# Patient Record
Sex: Male | Born: 1990 | Race: White | Hispanic: No | Marital: Married | Smoking: Never smoker
Health system: Southern US, Community
[De-identification: ages and names within clinical notes are randomized; demographics above are authoritative.]

## PROBLEM LIST (undated history)

## (undated) DIAGNOSIS — T7840XA Allergy, unspecified, initial encounter: Secondary | ICD-10-CM

## (undated) DIAGNOSIS — E785 Hyperlipidemia, unspecified: Secondary | ICD-10-CM

## (undated) DIAGNOSIS — F419 Anxiety disorder, unspecified: Secondary | ICD-10-CM

## (undated) HISTORY — DX: Hyperlipidemia, unspecified: E78.5

## (undated) HISTORY — DX: Allergy, unspecified, initial encounter: T78.40XA

## (undated) HISTORY — DX: Anxiety disorder, unspecified: F41.9

---
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
## 2010-11-12 LAB — URINALYSIS, ROUTINE W REFLEX MICROSCOPIC
Glucose, UA: NEGATIVE mg/dL
Hgb urine dipstick: NEGATIVE
Leukocytes, UA: NEGATIVE
Protein, ur: 30 mg/dL — AB
Specific Gravity, Urine: 1.025 (ref 1.005–1.030)
pH: 7 (ref 5.0–8.0)

## 2010-11-12 LAB — URINE MICROSCOPIC-ADD ON

## 2015-11-06 ENCOUNTER — Encounter: Payer: Self-pay | Admitting: Sports Medicine

## 2015-11-06 ENCOUNTER — Ambulatory Visit (INDEPENDENT_AMBULATORY_CARE_PROVIDER_SITE_OTHER): Payer: Commercial Managed Care - PPO | Admitting: Sports Medicine

## 2015-11-06 ENCOUNTER — Encounter (INDEPENDENT_AMBULATORY_CARE_PROVIDER_SITE_OTHER): Payer: Self-pay

## 2015-11-06 DIAGNOSIS — L03032 Cellulitis of left toe: Secondary | ICD-10-CM | POA: Diagnosis not present

## 2015-11-06 DIAGNOSIS — M79675 Pain in left toe(s): Secondary | ICD-10-CM | POA: Diagnosis not present

## 2015-11-06 DIAGNOSIS — L03012 Cellulitis of left finger: Secondary | ICD-10-CM

## 2015-11-06 MED ORDER — AMOXICILLIN-POT CLAVULANATE 875-125 MG PO TABS: 1.0000 | ORAL_TABLET | Freq: Two times a day (BID) | ORAL | 0 refills | Status: DC

## 2015-11-06 NOTE — Patient Instructions (Signed)

## 2015-11-06 NOTE — Progress Notes (Signed)
Subjective: Joshua Christian is a 25 y.o. male patient presents to office today complaining of a painful incurvated, red, hot, swollen lateral nail border of the first toe on the left foot. This has been present for weeks. Patient has treated this by trimming and soaking. Admits that several years ago had a nail procedure done, but appears that symptoms have recurred. Patient denies fever/chills/nausea/vomitting/any other related constitutional symptoms at this time.  There are no active problems to display for this patient.   No current outpatient prescriptions on file prior to visit.   No current facility-administered medications on file prior to visit.     No Known Allergies  Objective:  There were no vitals filed for this visit.  General: Well developed, nourished, in no acute distress, alert and oriented x3   Dermatology: Skin is warm, dry and supple bilateral. Left hallux nail appears to be severely incurvated with hyperkeratosis formation at the distal aspects of  the lateral nail border. (+) Erythema. (+) Edema. (-) serosanguous  drainage present. The remaining nails appear unremarkable at this time. There are no open sores, lesions or other signs of infection  present.  Vascular: Dorsalis Pedis artery and Posterior Tibial artery pedal pulses are 2/4 bilateral with immedate capillary fill time. Pedal hair growth present. No lower extremity edema.   Neruologic: Grossly intact via light touch bilateral.  Musculoskeletal: Tenderness to palpation of the left hallux lateral nail fold(s). Muscular strength within normal limits in all groups bilateral.   Assesement and Plan: Problem List Items Addressed This Visit    None    Visit Diagnoses    Paronychia, left    -  Primary   Relevant Medications   amoxicillin-clavulanate (AUGMENTIN) 875-125 MG tablet   Toe pain, left       Relevant Medications   amoxicillin-clavulanate (AUGMENTIN) 875-125 MG tablet      -Discussed  treatment alternatives and plan of care; Explained permanent/temporary nail avulsion and post procedure course to patient.Patient opt for permanent nail avulsion, left hallux lateral margin. - After a verbal consent, injected 3 ml of a 50:50 mixture of 2% plain  lidocaine and 0.5% plain marcaine in a normal hallux block fashion. Next, a  betadine prep was performed. Anesthesia was tested and found to be appropriate.  The offending left hallux lateral nail border was then incised from the hyponychium to the epinychium. The offending nail border was removed and cleared from the field. The area was curretted for any remaining nail or spicules. Phenol application performed and the area was then flushed with alcohol and dressed with antibiotic cream and a dry sterile dressing. -Patient was instructed to leave the dressing intact for today and begin soaking  in a weak solution of betadine and water tomorrow. Patient was instructed to  soak for 15 minutes each day and apply neosporin and a gauze or bandaid dressing each day. -Prescribed Augmentin -Patient was instructed to monitor the toe for signs of infection and return to office if toe becomes red, hot or swollen. -Advised ice, elevation, and tylenol or motrin if needed for pain.  -Patient is to return in 1-2 weeks for follow up care/nail check or sooner if problems arise.  Asencion Islamitorya Hilda Rynders, DPM

## 2015-11-20 ENCOUNTER — Ambulatory Visit (INDEPENDENT_AMBULATORY_CARE_PROVIDER_SITE_OTHER): Payer: Commercial Managed Care - PPO | Admitting: Sports Medicine

## 2015-11-20 ENCOUNTER — Encounter: Payer: Self-pay | Admitting: Sports Medicine

## 2015-11-20 DIAGNOSIS — M79675 Pain in left toe(s): Secondary | ICD-10-CM

## 2015-11-20 DIAGNOSIS — Z9889 Other specified postprocedural states: Secondary | ICD-10-CM

## 2015-11-20 NOTE — Progress Notes (Signed)
Subjective: Joshua Christian is a 25 y.o. male patient returns to office today for follow up evaluation after having Left Hallux lateral permanent nail avulsion performed on 11-06-15. Patient has been soaking using betadine and applying topical antibiotic covered with bandaid daily. Patient denies fever/chills/nausea/vomitting/any other related constitutional symptoms at this time.  There are no active problems to display for this patient.   Current Outpatient Prescriptions on File Prior to Visit  Medication Sig Dispense Refill  . amoxicillin-clavulanate (AUGMENTIN) 875-125 MG tablet Take 1 tablet by mouth 2 (two) times daily. 28 tablet 0   No current facility-administered medications on file prior to visit.     No Known Allergies  Objective:  General: Well developed, nourished, in no acute distress, alert and oriented x3   Dermatology: Skin is warm, dry and supple bilateral. Left hallux lateral nail bed appears to be clean, dry, with mild granular tissue and surrounding eschar/scab. (-) Erythema. (-) Edema. (-) serosanguous drainage present. The remaining nails appear unremarkable at this time. There are no other lesions or other signs of infection present.  Neurovascular status: Intact. No lower extremity swelling; No pain with calf compression bilateral.  Musculoskeletal: Decreased tenderness to palpation of the left hallux lateral nail fold. Muscular strength within normal limits bilateral.   Assesement and Plan: Problem List Items Addressed This Visit    None    Visit Diagnoses    S/P nail surgery    -  Primary   Toe pain, left          -Examined patient  -Cleansed left hallux lateral nail fold and gently scrubbed with peroxide and q-tip/curetted away eschar at site and applied antibiotic cream covered with bandaid.  -Discussed plan of care with patient. -Patient to now begin soaking in a weak solution of Epsom salt and warm water. Patient was instructed to soak for 15-20  minutes each day until the toe appears normal and there is no drainage, redness, tenderness, or swelling at the procedure site, and apply neosporin and a gauze or bandaid dressing each day as needed. May leave open to air at night. -Continue with augmentin until completed -Educated patient on long term care after nail surgery. -Patient was instructed to monitor the toe for reoccurrence and signs of infection; Patient advised to return to office or go to ER if toe becomes red, hot or swollen. -Patient is to return as needed or sooner if problems arise.  Joshua Christian, DPM

## 2015-11-20 NOTE — Patient Instructions (Signed)

## 2021-03-17 ENCOUNTER — Other Ambulatory Visit: Payer: Self-pay

## 2021-03-17 ENCOUNTER — Encounter (HOSPITAL_COMMUNITY): Payer: Self-pay | Admitting: *Deleted

## 2021-03-17 ENCOUNTER — Emergency Department (HOSPITAL_COMMUNITY)
Admission: EM | Admit: 2021-03-17 | Discharge: 2021-03-18 | Disposition: A | Discharge status: Home / Self Care | Payer: Managed Care, Other (non HMO) | Attending: Emergency Medicine | Admitting: Emergency Medicine

## 2021-03-17 DIAGNOSIS — R2681 Unsteadiness on feet: Secondary | ICD-10-CM | POA: Diagnosis not present

## 2021-03-17 DIAGNOSIS — R42 Dizziness and giddiness: Secondary | ICD-10-CM | POA: Diagnosis present

## 2021-03-17 DIAGNOSIS — H539 Unspecified visual disturbance: Secondary | ICD-10-CM

## 2021-03-17 DIAGNOSIS — R519 Headache, unspecified: Secondary | ICD-10-CM | POA: Diagnosis not present

## 2021-03-17 DIAGNOSIS — R413 Other amnesia: Secondary | ICD-10-CM

## 2021-03-17 DIAGNOSIS — R4189 Other symptoms and signs involving cognitive functions and awareness: Secondary | ICD-10-CM | POA: Diagnosis not present

## 2021-03-17 DIAGNOSIS — H538 Other visual disturbances: Secondary | ICD-10-CM | POA: Insufficient documentation

## 2021-03-17 DIAGNOSIS — R110 Nausea: Secondary | ICD-10-CM | POA: Insufficient documentation

## 2021-03-17 LAB — CBC WITH DIFFERENTIAL/PLATELET
Abs Immature Granulocytes: 0.03 10*3/uL (ref 0.00–0.07)
Basophils Absolute: 0.1 10*3/uL (ref 0.0–0.1)
Basophils Relative: 1 %
Eosinophils Absolute: 0.2 10*3/uL (ref 0.0–0.5)
Eosinophils Relative: 3 %
HCT: 50.7 % (ref 39.0–52.0)
Hemoglobin: 17.4 g/dL — ABNORMAL HIGH (ref 13.0–17.0)
Immature Granulocytes: 0 %
Lymphocytes Relative: 29 %
Lymphs Abs: 2.7 10*3/uL (ref 0.7–4.0)
MCH: 28.6 pg (ref 26.0–34.0)
MCHC: 34.3 g/dL (ref 30.0–36.0)
MCV: 83.3 fL (ref 80.0–100.0)
Monocytes Absolute: 0.7 10*3/uL (ref 0.1–1.0)
Monocytes Relative: 7 %
Neutro Abs: 5.6 10*3/uL (ref 1.7–7.7)
Neutrophils Relative %: 60 %
Platelets: 376 10*3/uL (ref 150–400)
RBC: 6.09 MIL/uL — ABNORMAL HIGH (ref 4.22–5.81)
RDW: 11.9 % (ref 11.5–15.5)
WBC: 9.3 10*3/uL (ref 4.0–10.5)
nRBC: 0 % (ref 0.0–0.2)

## 2021-03-17 LAB — BASIC METABOLIC PANEL
Anion gap: 10 (ref 5–15)
BUN: 12 mg/dL (ref 6–20)
CO2: 25 mmol/L (ref 22–32)
Calcium: 9.3 mg/dL (ref 8.9–10.3)
Chloride: 102 mmol/L (ref 98–111)
Creatinine, Ser: 0.81 mg/dL (ref 0.61–1.24)
GFR, Estimated: >60 mL/min (ref >60)
Glucose, Bld: 81 mg/dL (ref 70–99)
Potassium: 3.4 mmol/L — ABNORMAL LOW (ref 3.5–5.1)
Sodium: 137 mmol/L (ref 135–145)

## 2021-03-17 NOTE — ED Triage Notes (Signed)
Pt states he started Lexapro on Saturday. Pt says Sunday he felt nauseated. Monday, he was dizzy and couldn't remember how to tie his shoes. He has not had a dose since yesterday, but still feels dizzy. Headache on and off since the beginning of January since going to the [place]

## 2021-03-17 NOTE — ED Provider Triage Note (Signed)
Emergency Medicine Provider Triage Evaluation Note  Joshua Christian , a 31 y.o. male  was evaluated in triage.  Pt complains of dizziness and confusion since starting Lexapro.  Patient states he began Lexapro due to having anxiety, he was able to obtain the prescription medication through [email or web address]  Patient denies any preceding event, trauma.  Patient is alert and oriented x4.  Patient also states that since he returned from the [place] over Christmas he has had persistent headache.  Review of Systems  Positive: Confusion, weakness Negative: Nausea, vomiting, fevers  Physical Exam  BP (!) 161/90 (BP Location: Right Arm)    Pulse 84    Temp 98.3 F (36.8 C) (Oral)    Resp 16    Ht 6' (1.829 m)    Wt 88.5 kg    SpO2 98%    BMI 26.45 kg/m  Gen:   Awake, no distress   Resp:  Normal effort  MSK:   Moves extremities without difficulty  Other:  No neurodeficits on exam  Medical Decision Making  Medically screening exam initiated at 7:47 PM.  Appropriate orders placed.  Joshua Christian was informed that the remainder of the evaluation will be completed by another provider, this initial triage assessment does not replace that evaluation, and the importance of remaining in the ED until their evaluation is complete.     Al Decant, PA-C 03/17/21 1949

## 2021-03-18 ENCOUNTER — Emergency Department (HOSPITAL_COMMUNITY): Payer: Managed Care, Other (non HMO)

## 2021-03-18 LAB — URINALYSIS, ROUTINE W REFLEX MICROSCOPIC
Bilirubin Urine: NEGATIVE
Glucose, UA: NEGATIVE mg/dL
Hgb urine dipstick: NEGATIVE
Ketones, ur: NEGATIVE mg/dL
Leukocytes,Ua: NEGATIVE
Nitrite: NEGATIVE
Protein, ur: NEGATIVE mg/dL
Specific Gravity, Urine: 1.023 (ref 1.005–1.030)
pH: 8 (ref 5.0–8.0)

## 2021-03-18 LAB — HIV ANTIBODY (ROUTINE TESTING W REFLEX): HIV Screen 4th Generation wRfx: NONREACTIVE

## 2021-03-18 IMAGING — MR MR MRV HEAD WO/W CM
4 of 5 series · 19 of 48 positions shown · IV contrast (gadavist)
Comparison: MRI head of the same date

CLINICAL DATA: Dural venous sinus thrombosis suspected. Headache
and dizziness.

EXAM:
MR VENOGRAM HEAD WITHOUT AND WITH CONTRAST
TECHNIQUE: Angiographic images of the intracranial venous structures were
acquired using MRV technique without and with intravenous contrast.
CONTRAST:  9mL GADAVIST GADOBUTROL 1 MMOL/ML IV SOLN

[Series 4: sag inhance(25 venc) · sagittal · 1.6mm · 0.47mm/px · 9 of 397 slices shown]
[im 20/397]
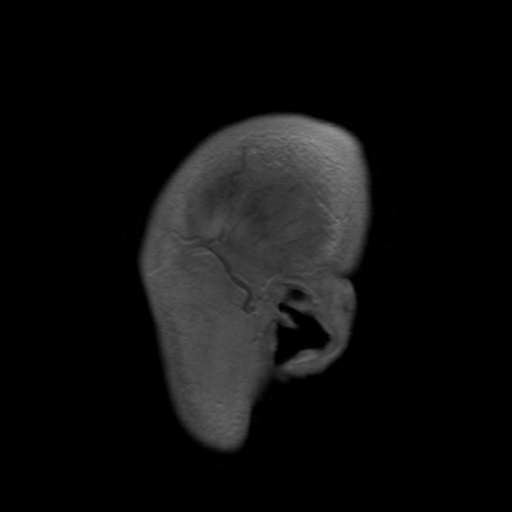
[im 60/397]
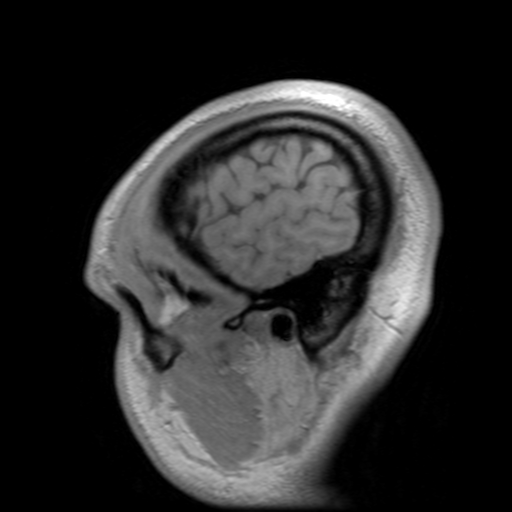
[im 119/397]
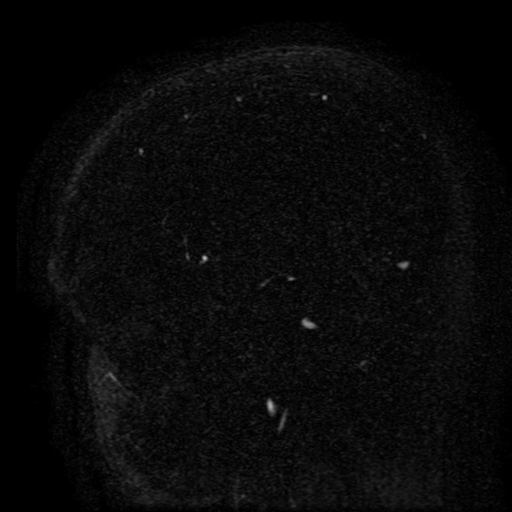
[im 179/397]
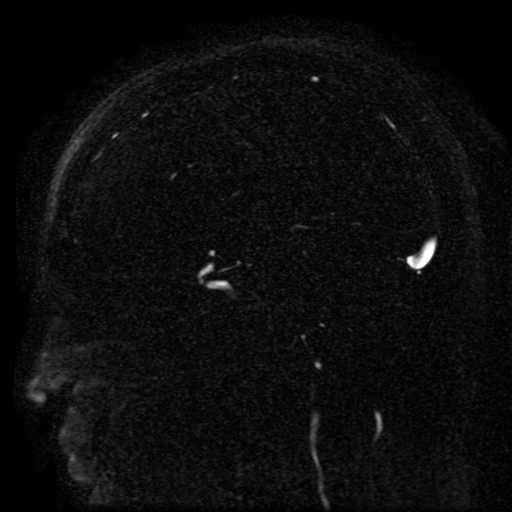
[im 199/397]
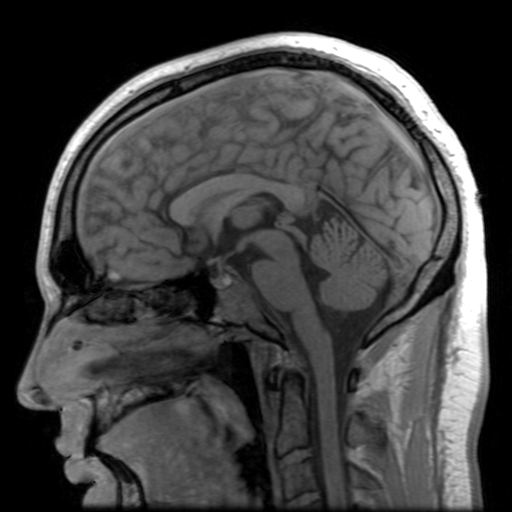
[im 218/397]
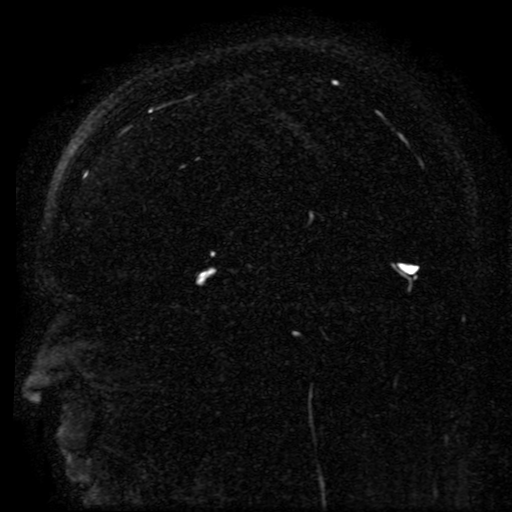
[im 278/397]
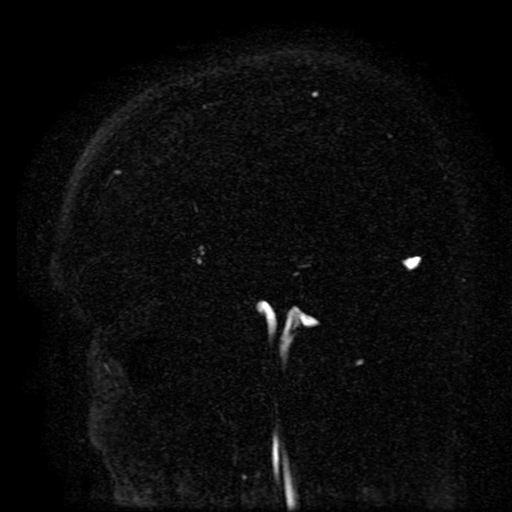
[im 337/397]
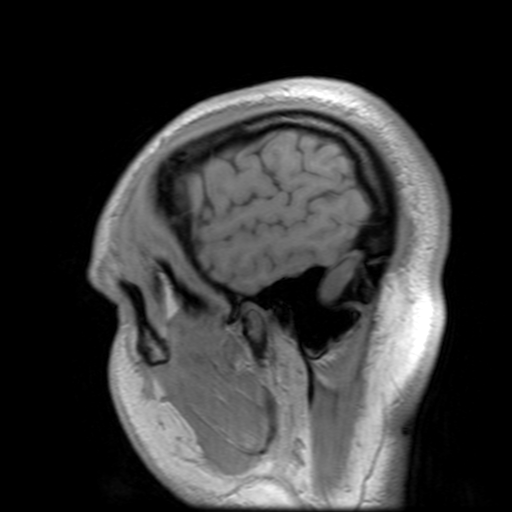
[im 377/397]
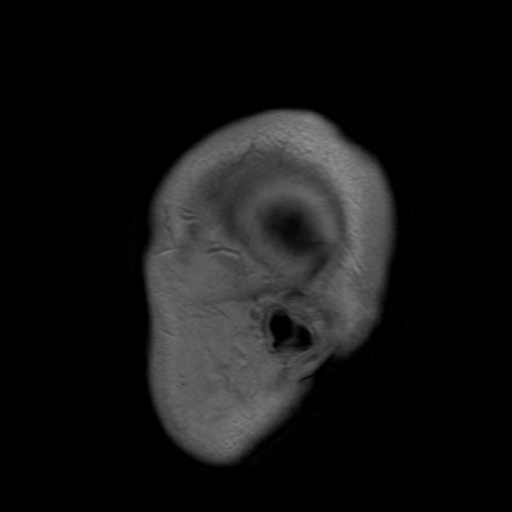

[Series 5: MRV · coronal · 1.5mm · 0.47mm/px · 4 of 130 slices shown]
[im 1/130]
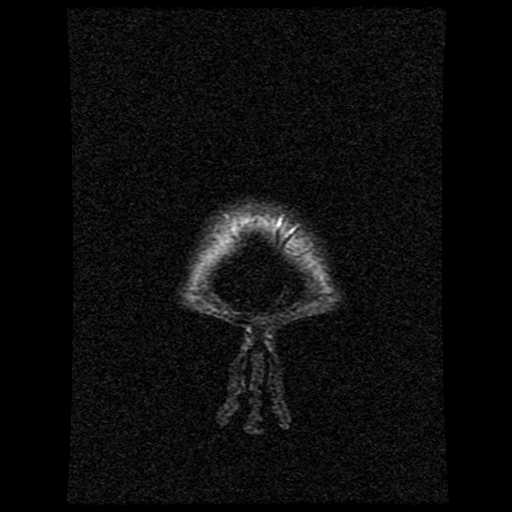
[im 22/130]
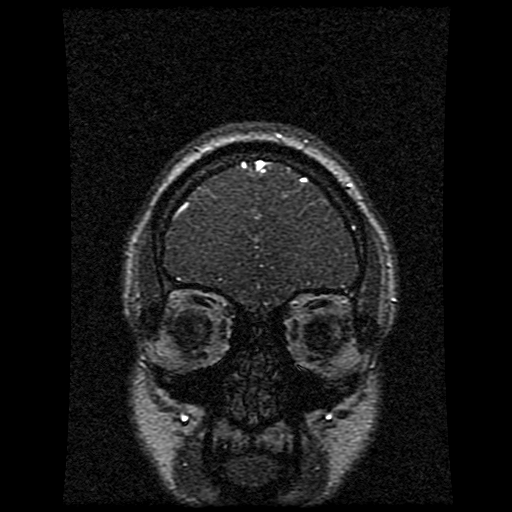
[im 65/130]
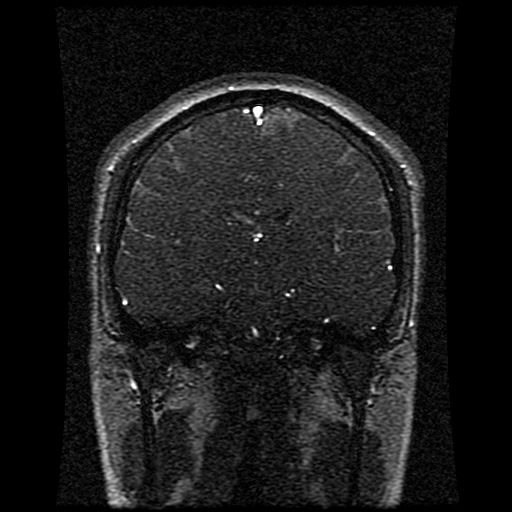
[im 108/130]
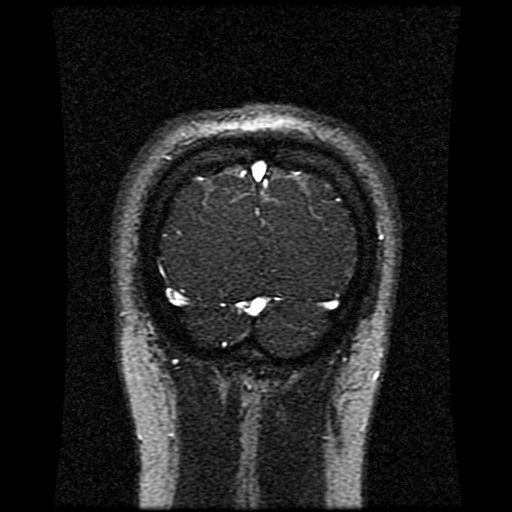

[Series 2200: processed images · axial · 1.0mm · 0.51mm/px · z∈[+26,+144]mm · 3 of 89 slices shown (1 of 2)]
[im 1/89]
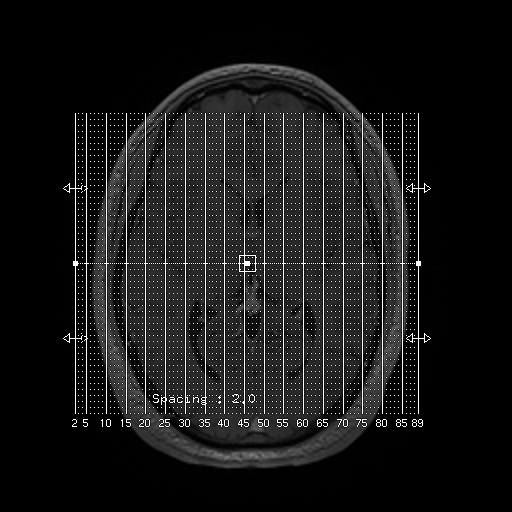
[im 45/89]
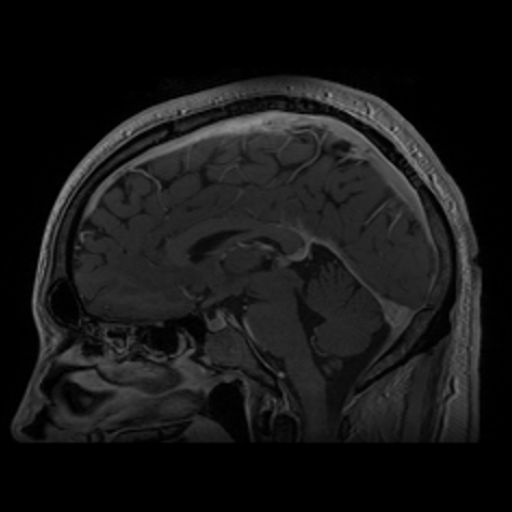
[im 89/89]
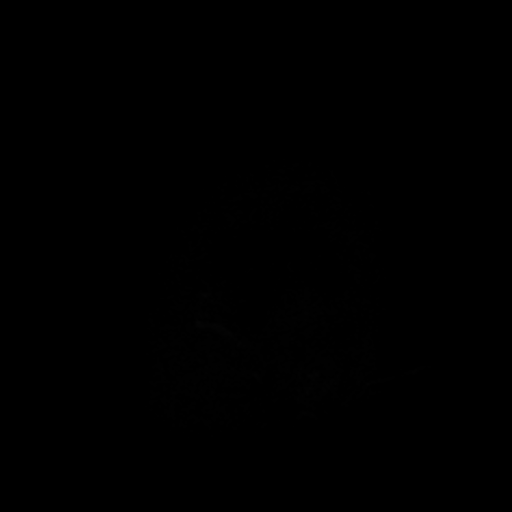

[Series 2201: processed images · coronal · 2.0mm · 0.47mm/px · 3 of 108 slices shown (2 of 2)]
[im 22/108]
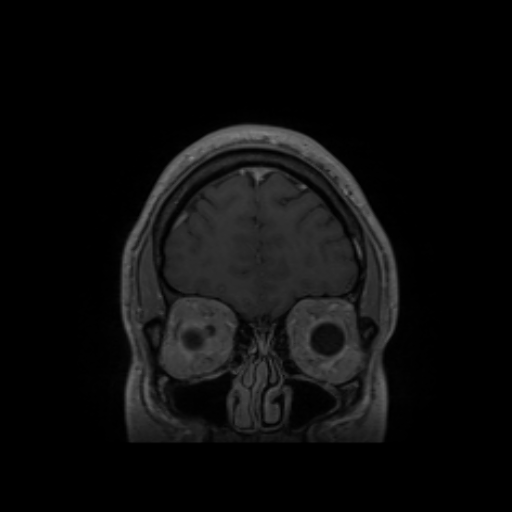
[im 65/108]
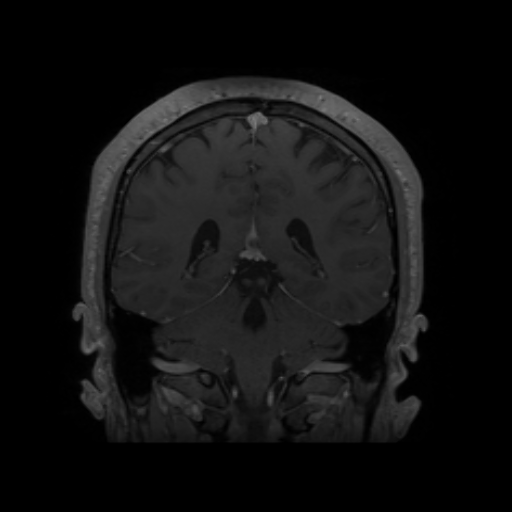
[im 108/108]
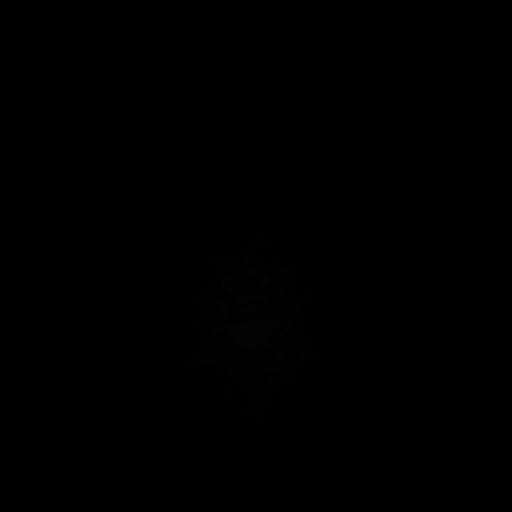

[19 of 48 positions shown; findings below may reference images not displayed]

FINDINGS: Normal flow related signal in the dural venous sinuses. Following
contrast infusion, there is normal enhancement of the dural venous
sinuses. No filling defect or obstruction.

Normal enhancement of the superior sagittal sinus. Normal
enhancement of the internal cerebral veins and straight sinus.
Transverse sinus, sigmoid sinus, and proximal jugular vein show
normal enhancement.
IMPRESSION: Normal MRV head.

## 2021-03-18 IMAGING — MR MR HEAD WO/W CM
9 of 12 series · 34 of 48 positions shown · IV contrast (gadavist)
Comparison: None.

CLINICAL DATA: Acute neuro deficit. Rule out stroke. Severe
headache. Intermittent vision changes. Unsteady gait.

EXAM:
MRI HEAD WITHOUT AND WITH CONTRAST
MRA HEAD WITHOUT CONTRAST
MRA NECK WITHOUT AND WITH CONTRAST
TECHNIQUE: Multiplanar, multiecho pulse sequences of the brain and surrounding
structures were obtained without and with intravenous contrast.
Angiographic images of the Circle of Willis were obtained using MRA
technique without intravenous contrast. Angiographic images of the
neck were obtained using MRA technique without and with intravenous
contrast. Carotid stenosis measurements (when applicable) are
obtained utilizing NASCET criteria, using the distal internal
carotid diameter as the denominator.
CONTRAST:  9mL GADAVIST GADOBUTROL 1 MMOL/ML IV SOLN

[Series 6: DWI · axial · 3.0mm · 1.09mm/px · z∈[-56,+97]mm · 8 of 104 slices shown (1 of 4)]
[im 1/104]
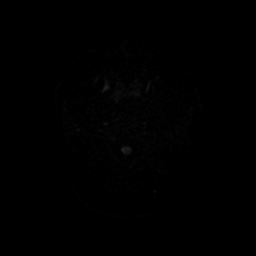
[im 12/104]
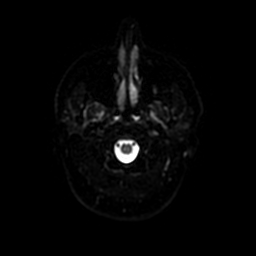
[im 35/104]
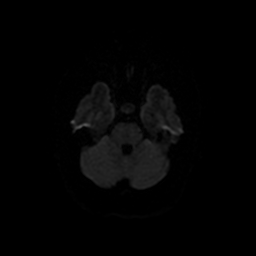
[im 46/104]
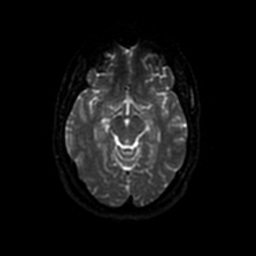
[im 58/104]
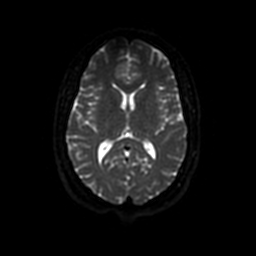
[im 69/104]
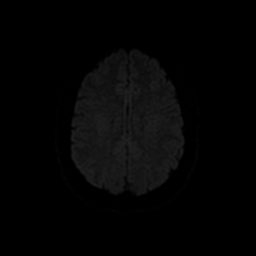
[im 92/104]
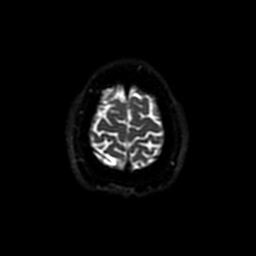
[im 104/104]
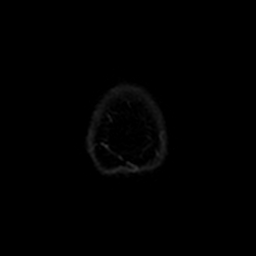

[Series 7: DWI · coronal · 5.0mm · 1.09mm/px · 7 of 80 slices shown (2 of 4)]
[im 1/80]
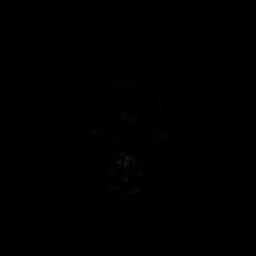
[im 14/80]
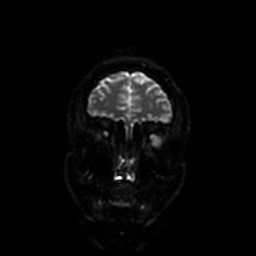
[im 27/80]
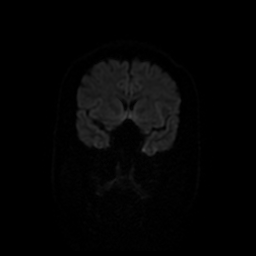
[im 40/80]
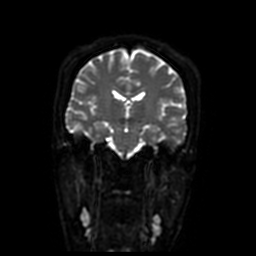
[im 53/80]
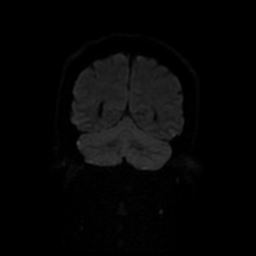
[im 66/80]
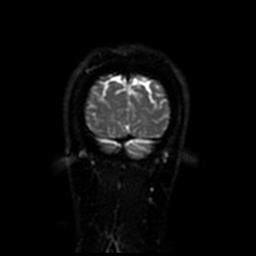
[im 80/80]
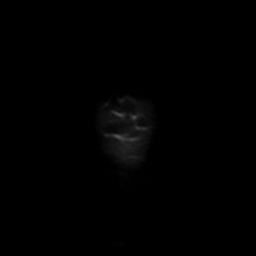

[Series 9: T2 · axial · 5.0mm · 0.45mm/px · z∈[-53,+91]mm · 2 of 25 slices shown]
[im 1/25]
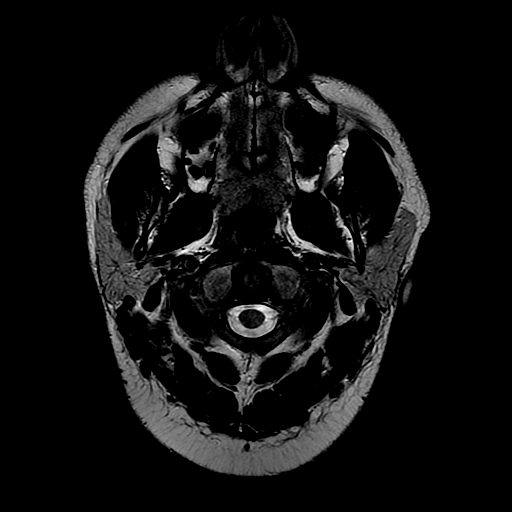
[im 25/25]
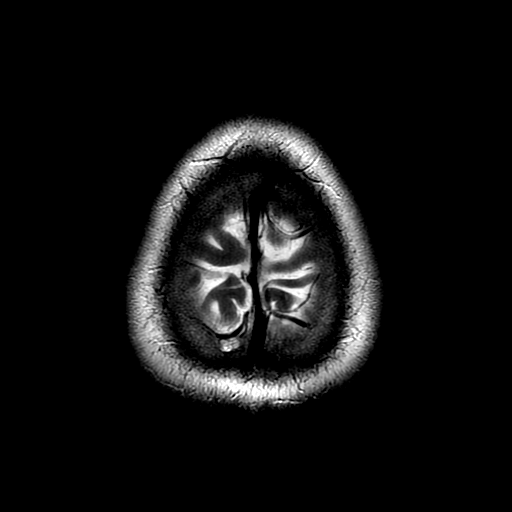

[Series 10: FLAIR · axial · 3.0mm · 0.45mm/px · z∈[-53,+91]mm · 2 of 25 slices shown]
[im 1/25]
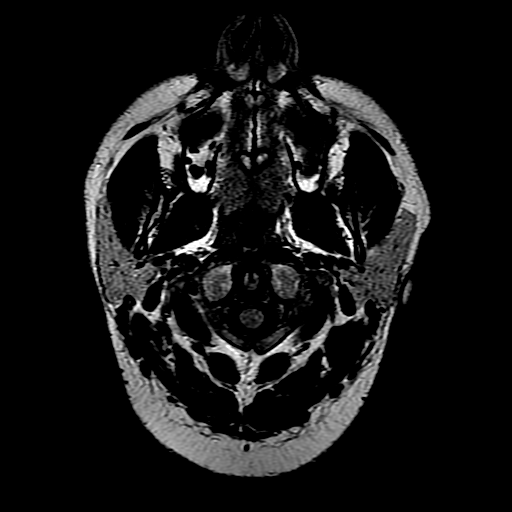
[im 25/25]
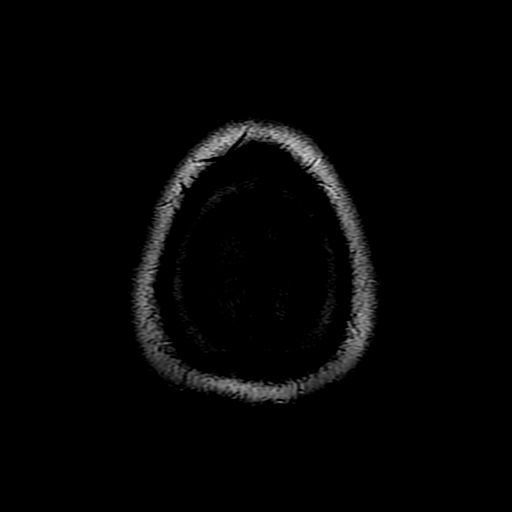

[Series 18: T2 post-contrast · coronal · 5.0mm · 0.39mm/px · 2 of 24 slices shown]
[im 1/24]
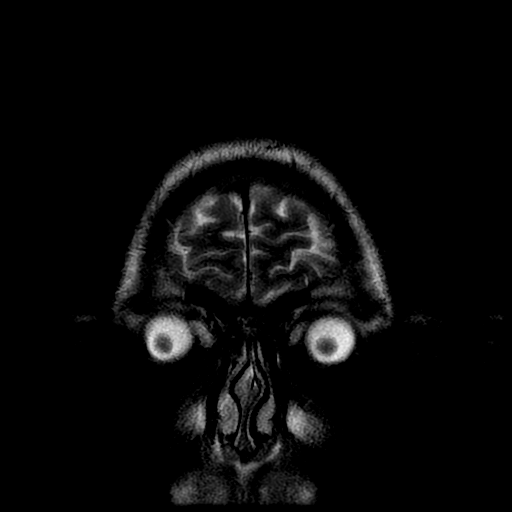
[im 24/24]
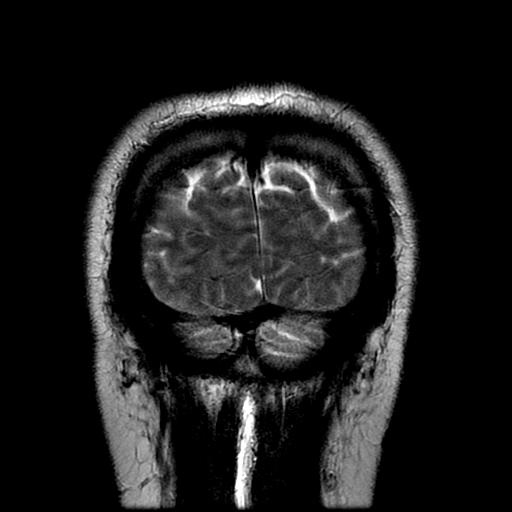

[Series 19: T1 post-contrast · axial · 3.0mm · 0.45mm/px · z∈[-54,+92]mm · 4 of 50 slices shown (1 of 2)]
[im 1/50]
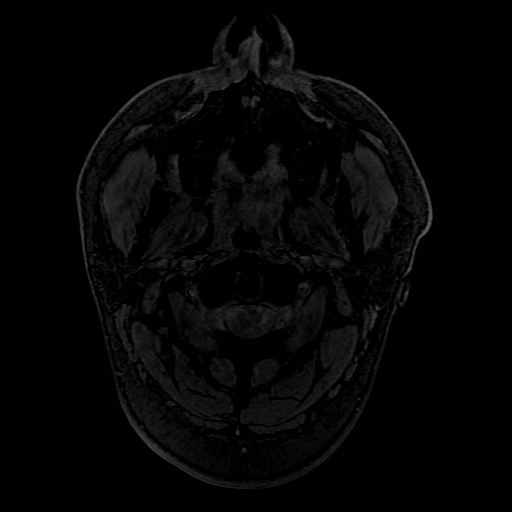
[im 17/50]
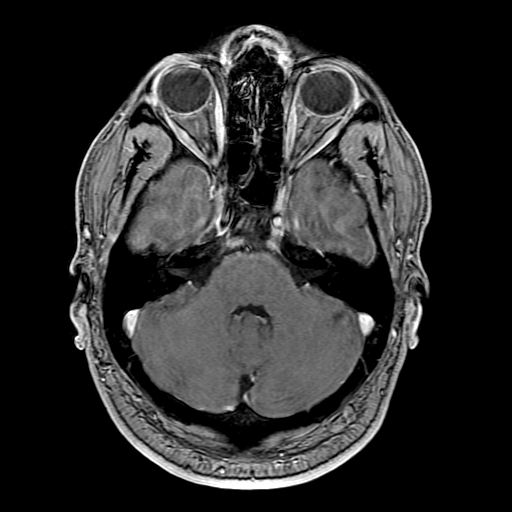
[im 33/50]
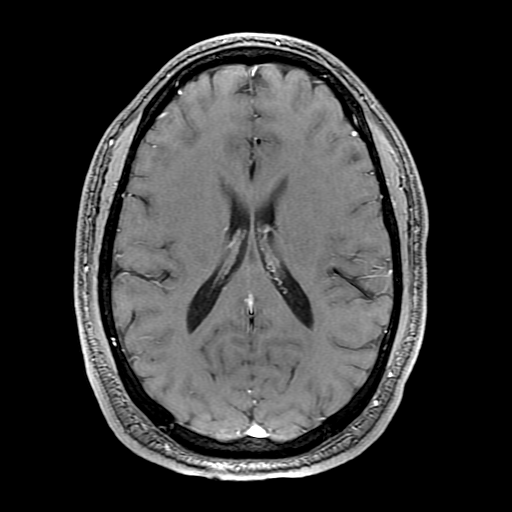
[im 50/50]
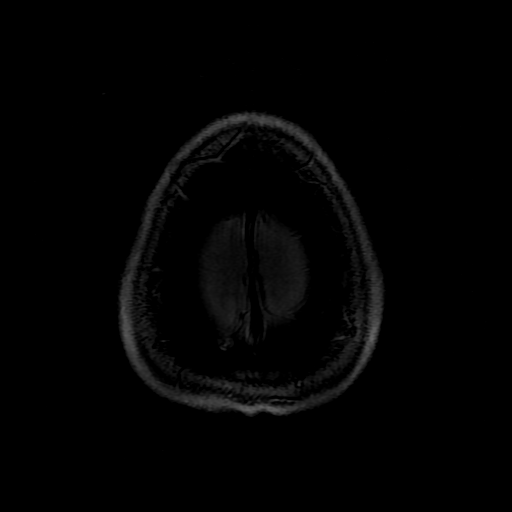

[Series 20: T1 post-contrast · coronal · 5.0mm · 0.39mm/px · 2 of 24 slices shown (2 of 2)]
[im 1/24]
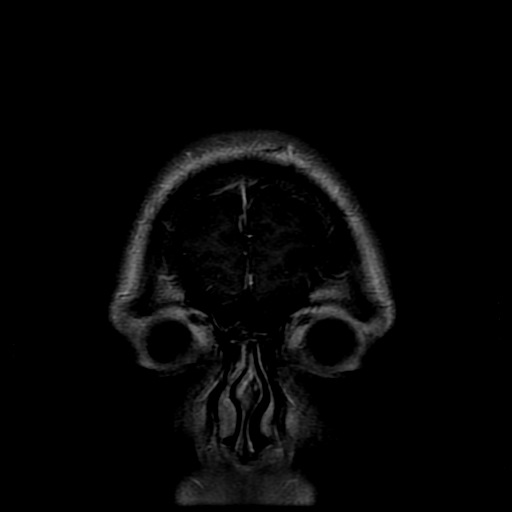
[im 24/24]
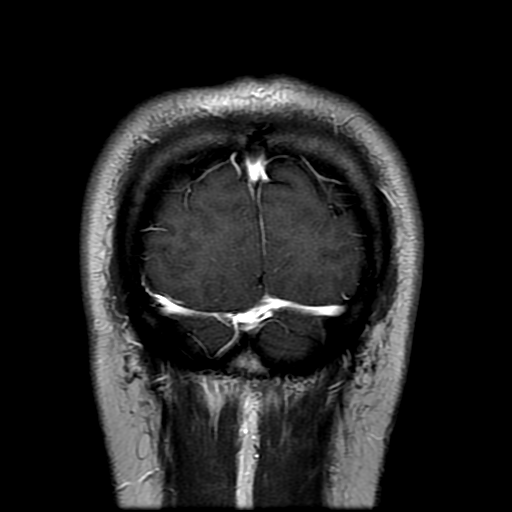

[Series 600: DWI · axial · 3.0mm · 1.09mm/px · z∈[-56,+97]mm · 4 of 52 slices shown (3 of 4)]
[im 1/52]
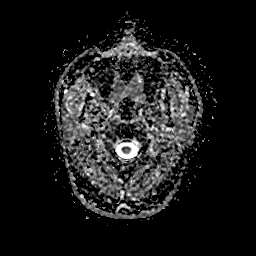
[im 18/52]
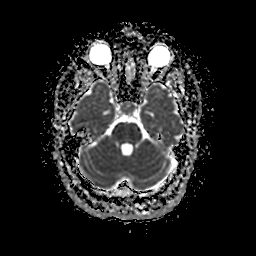
[im 35/52]
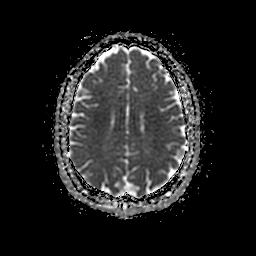
[im 52/52]
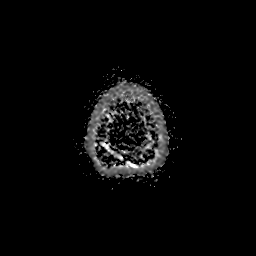

[Series 700: DWI · coronal · 5.0mm · 1.09mm/px · 3 of 40 slices shown (4 of 4)]
[im 1/40]
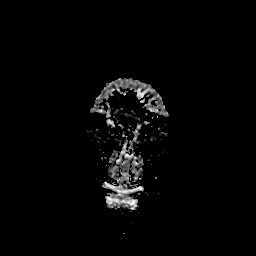
[im 20/40]
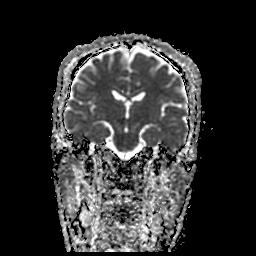
[im 40/40]
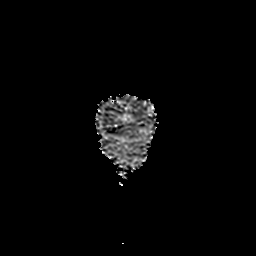

[34 of 48 positions shown; findings below may reference images not displayed]

FINDINGS: MRI HEAD FINDINGS

Brain: No acute infarction, hemorrhage, hydrocephalus, extra-axial
collection or mass lesion. Normal white matter. Negative for
demyelinating disease. Normal enhancement of the brain.

Vascular: Normal arterial flow voids.  Normal venous enhancement.

Skull and upper cervical spine: No focal skeletal lesion.

Sinuses/Orbits: Mild mucosal edema paranasal sinuses. Negative orbit

Other: None

MRA HEAD FINDINGS

Internal carotid artery widely patent without stenosis or aneurysm.
Anterior and middle cerebral arteries widely patent bilaterally.

Both vertebral arteries widely patent. PICA patent bilaterally.
Basilar widely patent. Superior cerebellar and posterior cerebral
arteries patent bilaterally. Fetal origin right posterior cerebral
artery. Left posterior communicating artery patent.

Negative for cerebral aneurysm.

MRA NECK FINDINGS

Normal aortic arch and proximal great vessels. Left vertebral artery
origin from the arch, a normal variant.

Antegrade flow in the carotid and vertebral artery bilaterally

Internal carotid artery widely patent bilaterally. Carotid
bifurcation normal. No stenosis or irregularity.

Both vertebral arteries patent to the basilar without stenosis.
IMPRESSION: 1. Normal MRI head without with contrast
2. Normal MRA head
3. Normal MRA neck

## 2021-03-18 IMAGING — MR MR MRA HEAD W/O CM
2 series · 19 of 48 positions shown · IV contrast (gadavist)
Comparison: None.

CLINICAL DATA: Acute neuro deficit. Rule out stroke. Severe
headache. Intermittent vision changes. Unsteady gait.

EXAM:
MRI HEAD WITHOUT AND WITH CONTRAST
MRA HEAD WITHOUT CONTRAST
MRA NECK WITHOUT AND WITH CONTRAST
TECHNIQUE: Multiplanar, multiecho pulse sequences of the brain and surrounding
structures were obtained without and with intravenous contrast.
Angiographic images of the Circle of Willis were obtained using MRA
technique without intravenous contrast. Angiographic images of the
neck were obtained using MRA technique without and with intravenous
contrast. Carotid stenosis measurements (when applicable) are
obtained utilizing NASCET criteria, using the distal internal
carotid diameter as the denominator.
CONTRAST:  9mL GADAVIST GADOBUTROL 1 MMOL/ML IV SOLN

[Series 3: (id) mt fs · axial · 1.4mm · 0.43mm/px · z∈[-63,+27]mm · 18 of 136 slices shown]
[im 1/136]
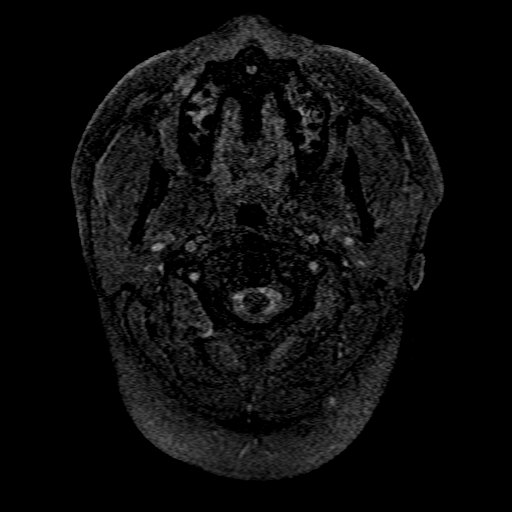
[im 3/136]
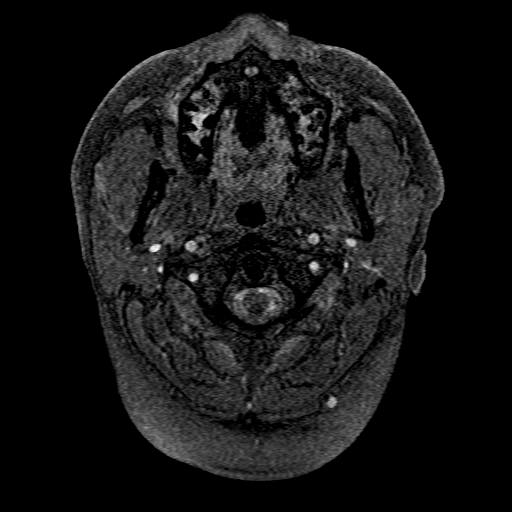
[im 6/136]
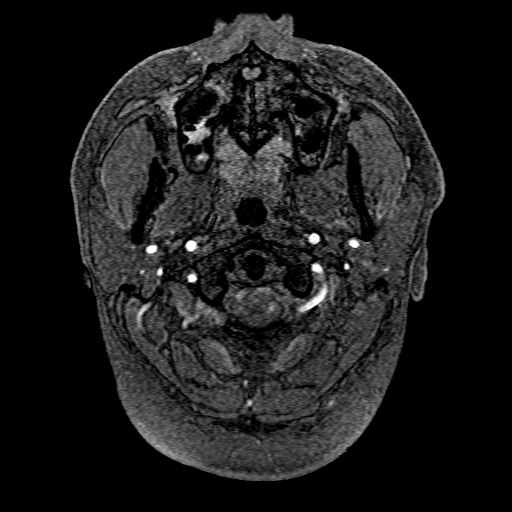
[im 9/136]
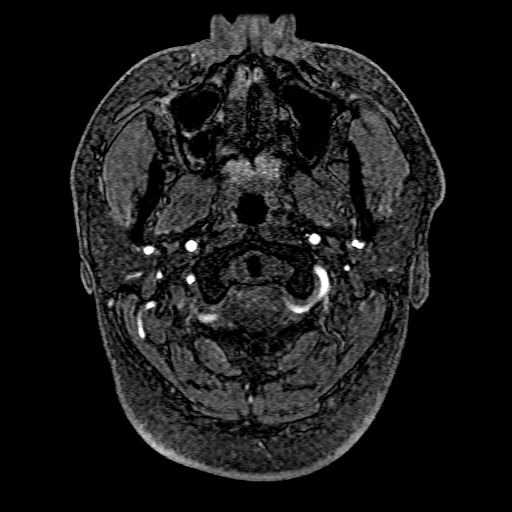
[im 12/136]
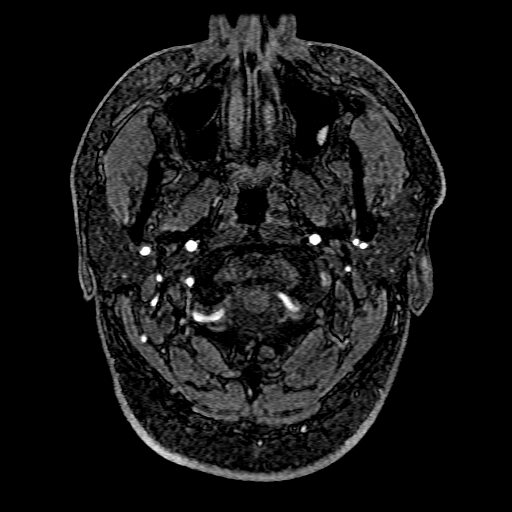
[im 15/136]
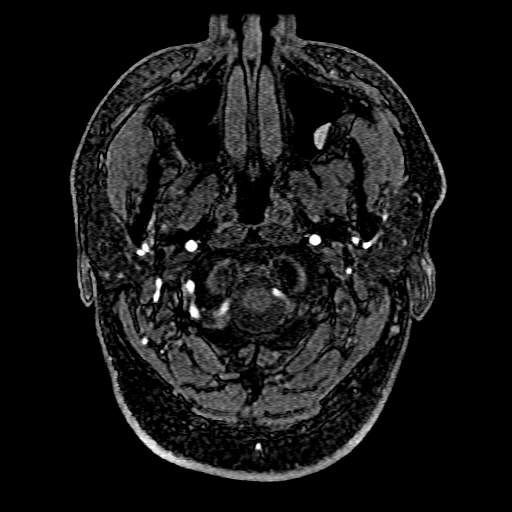
[im 18/136]
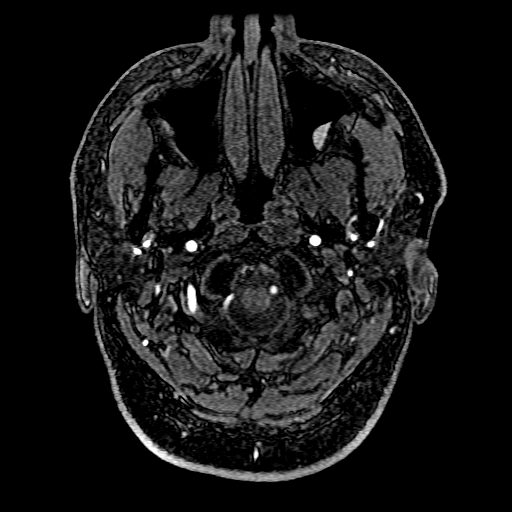
[im 21/136]
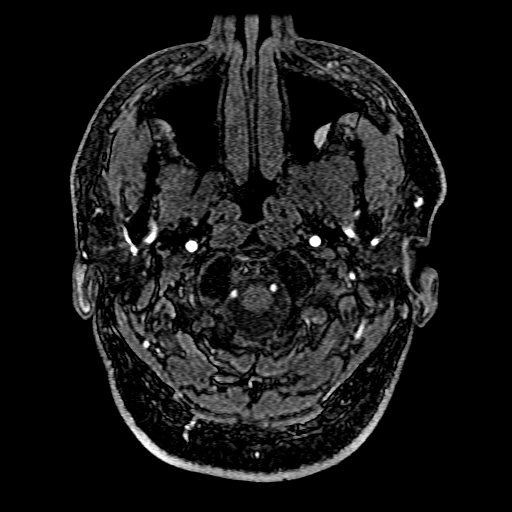
[im 24/136]
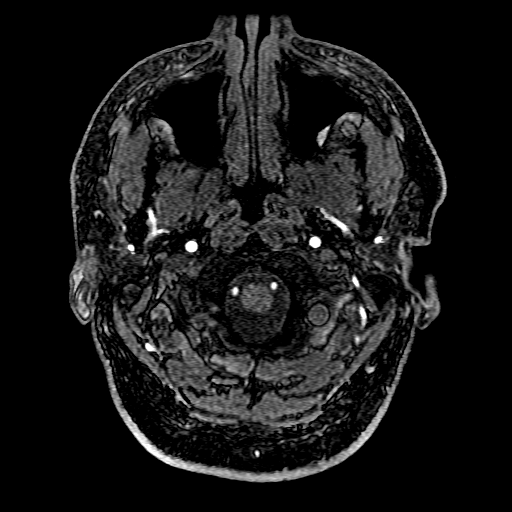
[im 27/136]
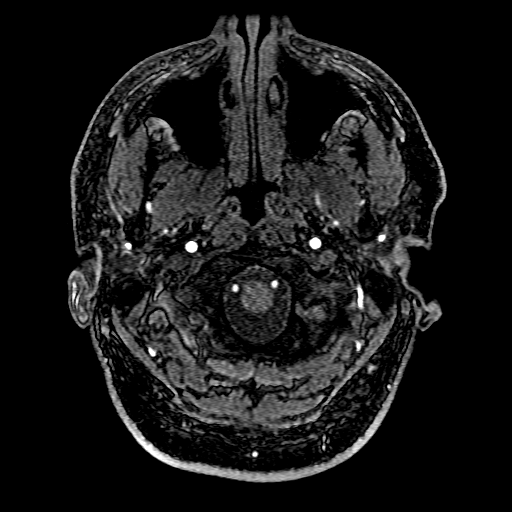
[im 42/136]
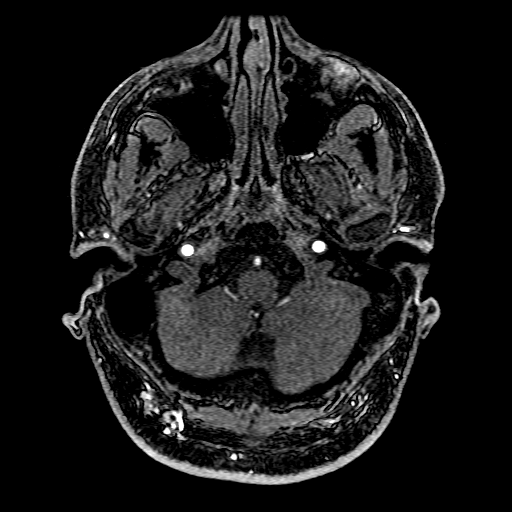
[im 59/136]
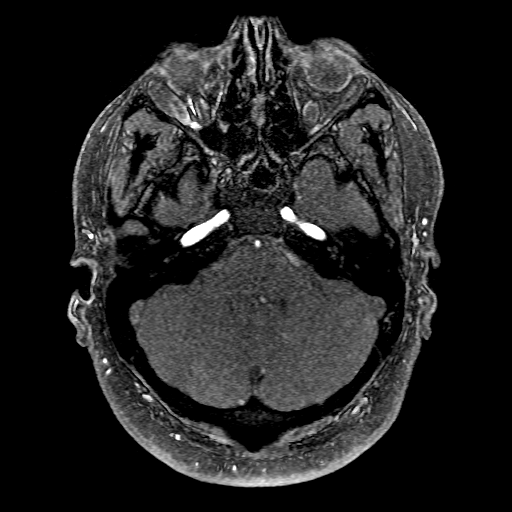
[im 68/136]
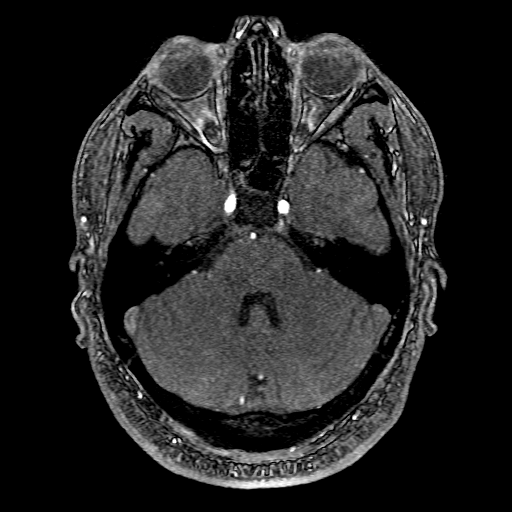
[im 77/136]
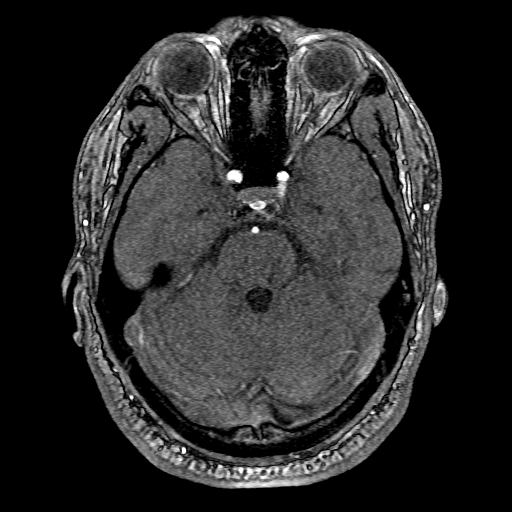
[im 94/136]
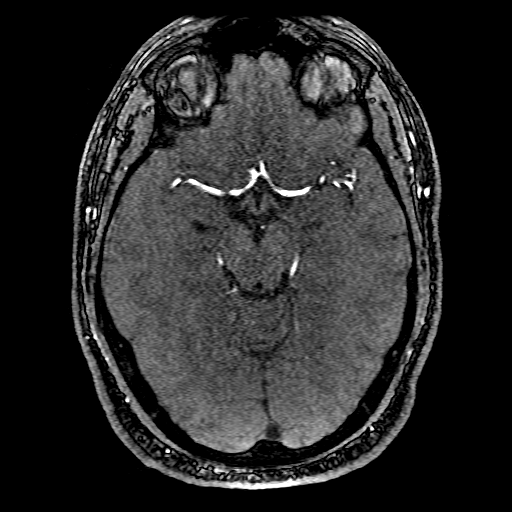
[im 112/136]
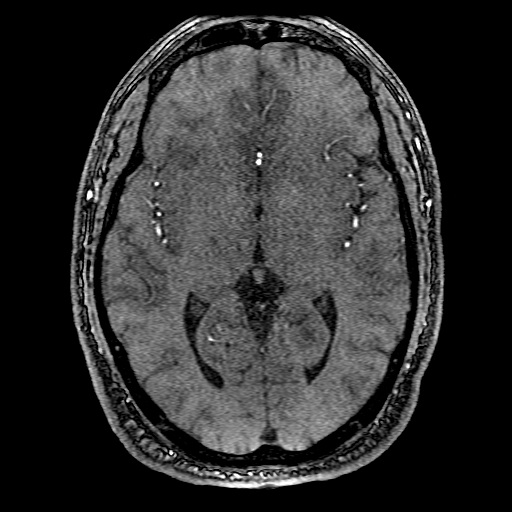
[im 115/136]
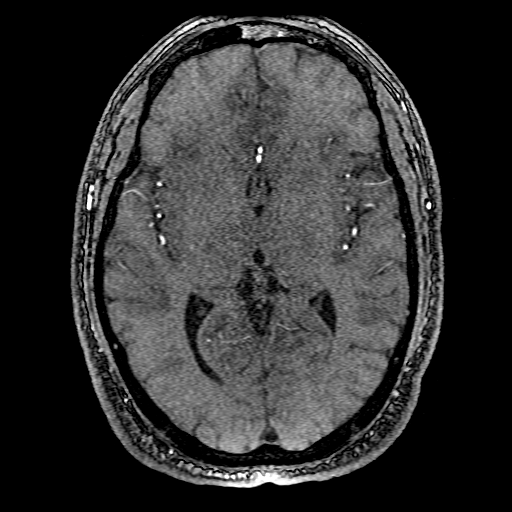
[im 130/136]
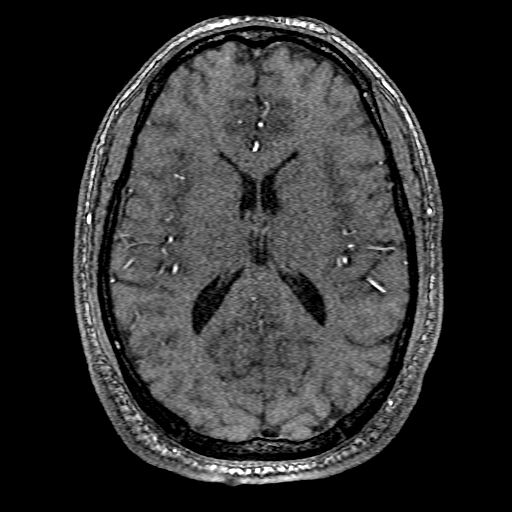

[Series 306: processed images · axial · 1.4mm · 0.43mm/px · 1 of 1 slices shown]
[im 1/1]
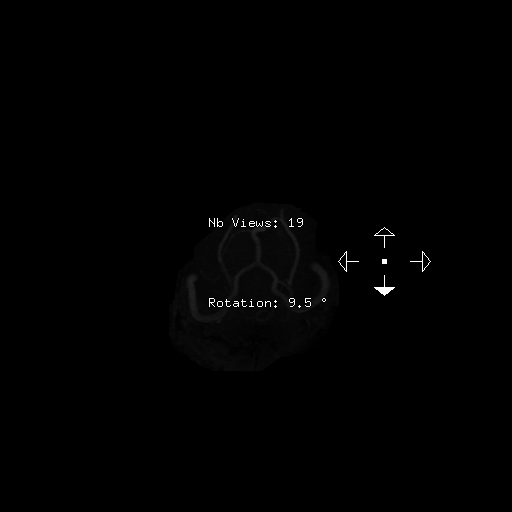

[19 of 48 positions shown; findings below may reference images not displayed]

FINDINGS: MRI HEAD FINDINGS

Brain: No acute infarction, hemorrhage, hydrocephalus, extra-axial
collection or mass lesion. Normal white matter. Negative for
demyelinating disease. Normal enhancement of the brain.

Vascular: Normal arterial flow voids.  Normal venous enhancement.

Skull and upper cervical spine: No focal skeletal lesion.

Sinuses/Orbits: Mild mucosal edema paranasal sinuses. Negative orbit

Other: None

MRA HEAD FINDINGS

Internal carotid artery widely patent without stenosis or aneurysm.
Anterior and middle cerebral arteries widely patent bilaterally.

Both vertebral arteries widely patent. PICA patent bilaterally.
Basilar widely patent. Superior cerebellar and posterior cerebral
arteries patent bilaterally. Fetal origin right posterior cerebral
artery. Left posterior communicating artery patent.

Negative for cerebral aneurysm.

MRA NECK FINDINGS

Normal aortic arch and proximal great vessels. Left vertebral artery
origin from the arch, a normal variant.

Antegrade flow in the carotid and vertebral artery bilaterally

Internal carotid artery widely patent bilaterally. Carotid
bifurcation normal. No stenosis or irregularity.

Both vertebral arteries patent to the basilar without stenosis.
IMPRESSION: 1. Normal MRI head without with contrast
2. Normal MRA head
3. Normal MRA neck

## 2021-03-18 IMAGING — MR MR MRA NECK WO/W CM
4 of 6 series · 18 of 48 positions shown · IV contrast (gadavist)
Comparison: None.

CLINICAL DATA: Acute neuro deficit. Rule out stroke. Severe
headache. Intermittent vision changes. Unsteady gait.

EXAM:
MRI HEAD WITHOUT AND WITH CONTRAST
MRA HEAD WITHOUT CONTRAST
MRA NECK WITHOUT AND WITH CONTRAST
TECHNIQUE: Multiplanar, multiecho pulse sequences of the brain and surrounding
structures were obtained without and with intravenous contrast.
Angiographic images of the Circle of Willis were obtained using MRA
technique without intravenous contrast. Angiographic images of the
neck were obtained using MRA technique without and with intravenous
contrast. Carotid stenosis measurements (when applicable) are
obtained utilizing NASCET criteria, using the distal internal
carotid diameter as the denominator.
CONTRAST:  9mL GADAVIST GADOBUTROL 1 MMOL/ML IV SOLN

[Series 15: ax (id) · axial · 2.8mm · 0.49mm/px · z∈[-247,-54]mm · 9 of 140 slices shown]
[im 1/140]
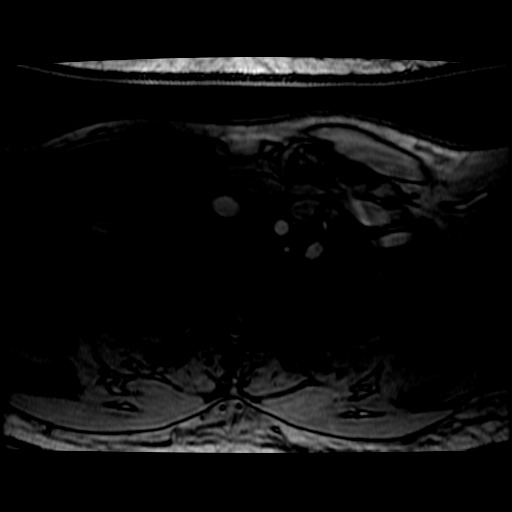
[im 18/140]
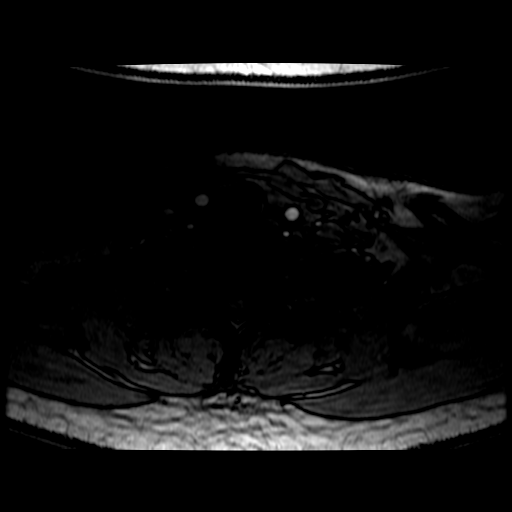
[im 35/140]
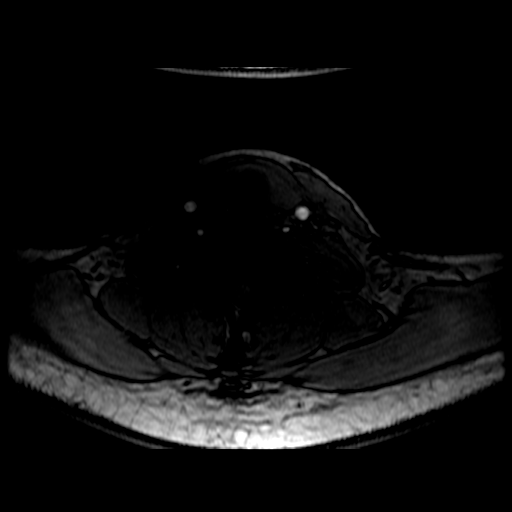
[im 53/140]
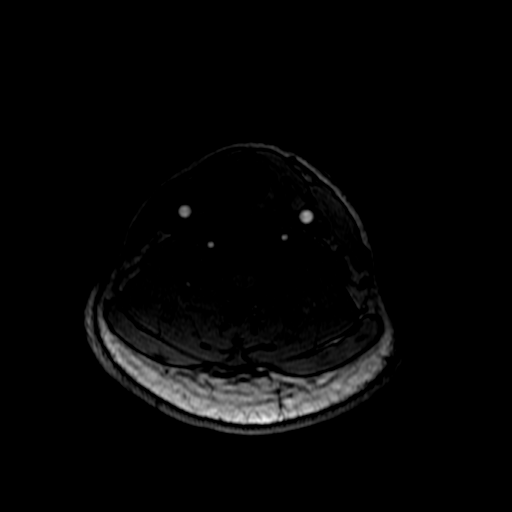
[im 70/140]
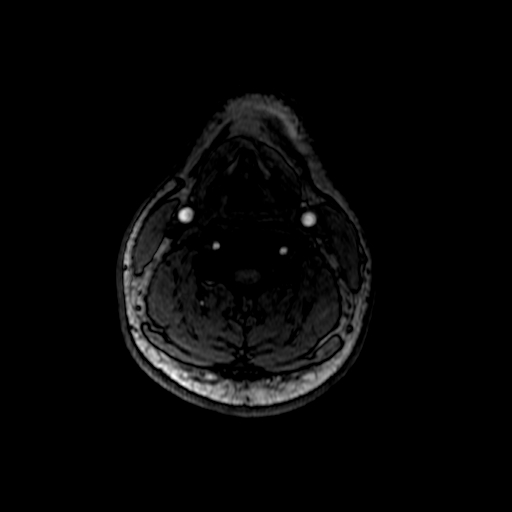
[im 87/140]
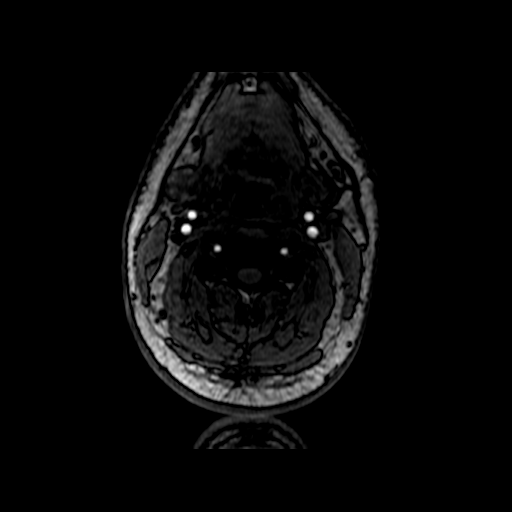
[im 105/140]
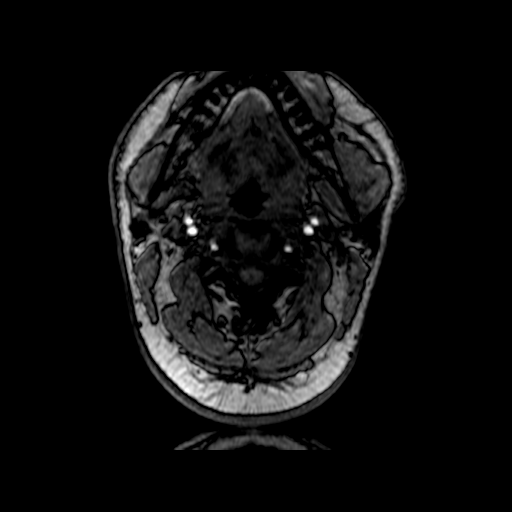
[im 122/140]
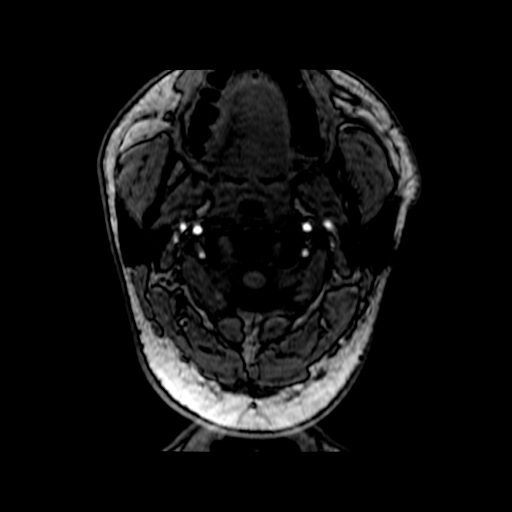
[im 140/140]
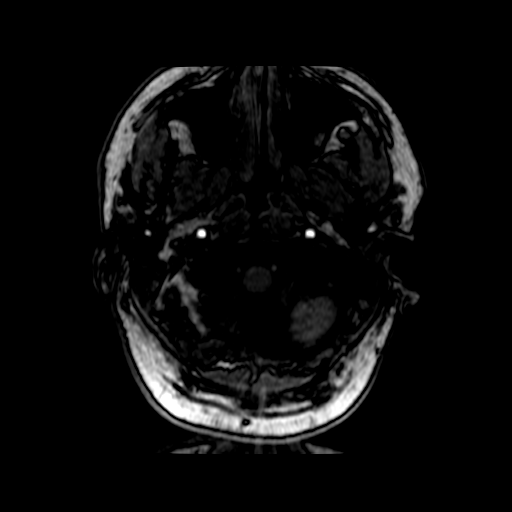

[Series 1700: cor cemra ft · coronal · 1.4mm · 0.59mm/px · 3 of 112 slices shown]
[im 19/112]
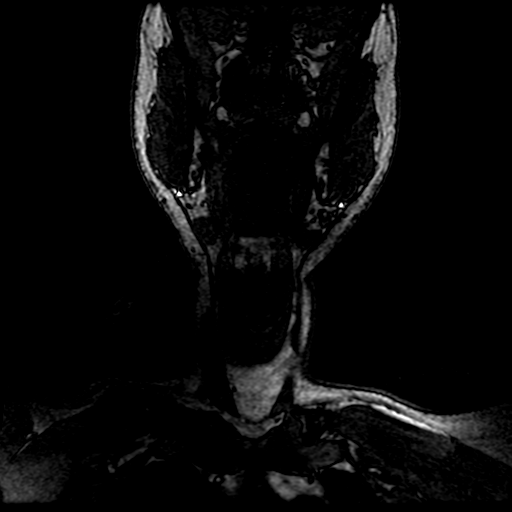
[im 56/112]
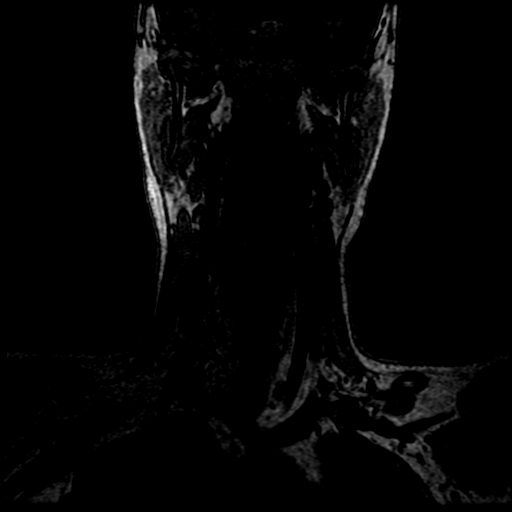
[im 93/112]
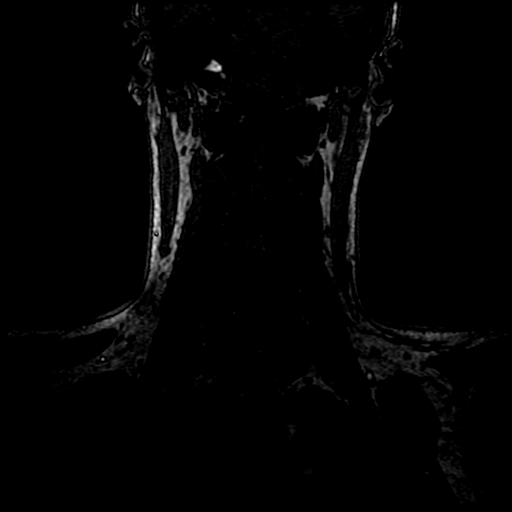

[Series 1701: ph1/cor cemra ft · coronal · 1.4mm · 0.59mm/px · 3 of 112 slices shown]
[im 16/112]
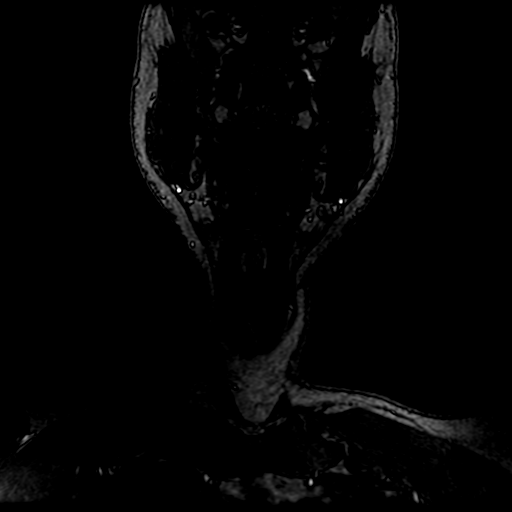
[im 64/112]
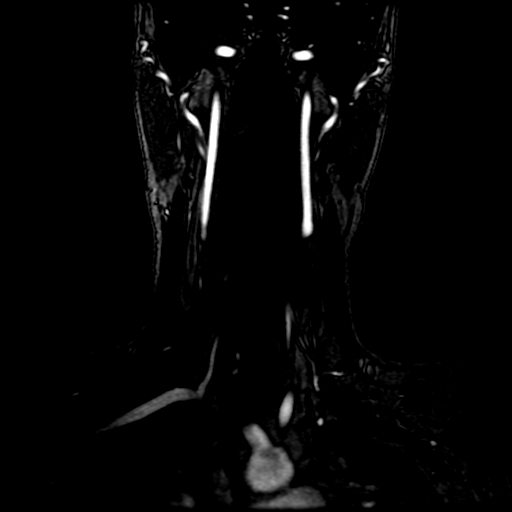
[im 96/112]
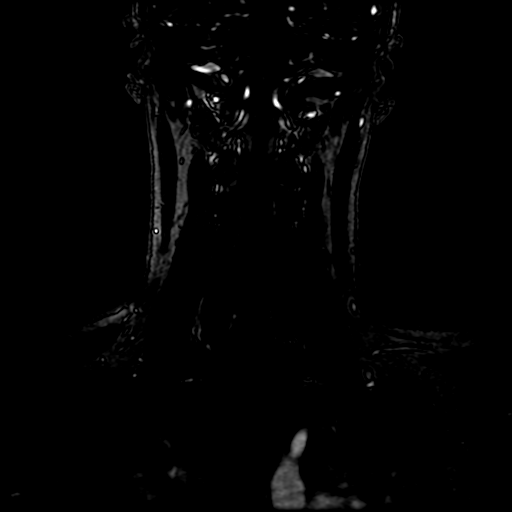

[Series 1702: ph2/cor cemra ft · coronal · 1.4mm · 0.59mm/px · 3 of 112 slices shown]
[im 16/112]
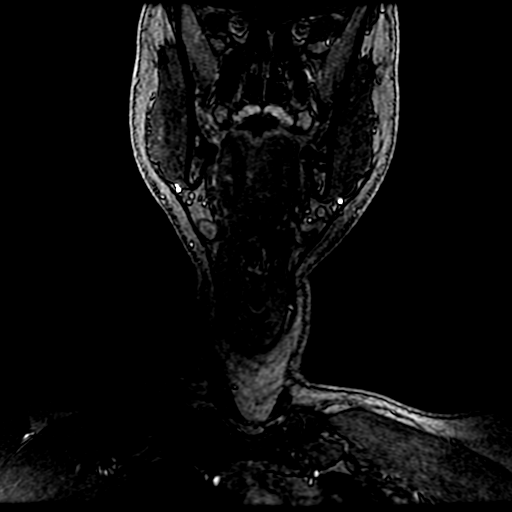
[im 64/112]
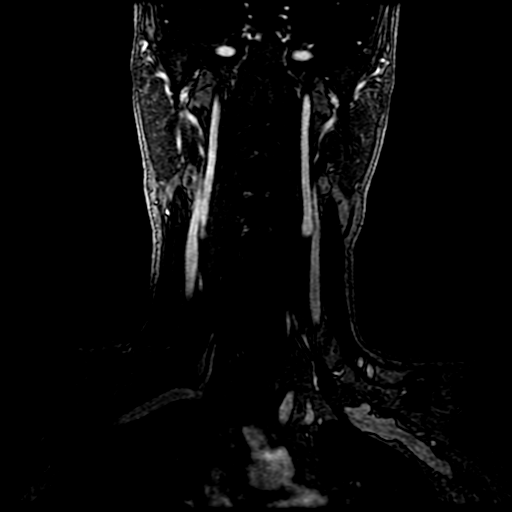
[im 96/112]
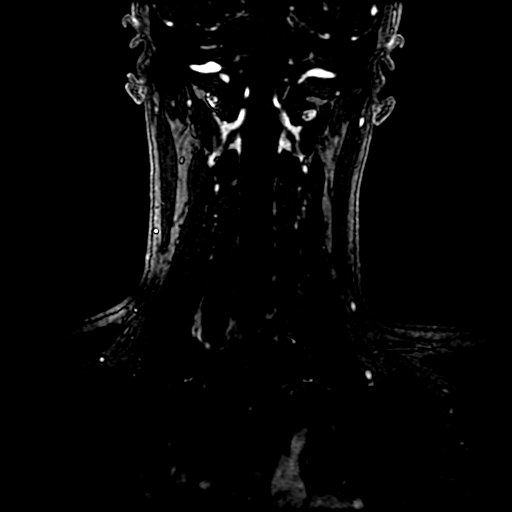

[18 of 48 positions shown; findings below may reference images not displayed]

FINDINGS: MRI HEAD FINDINGS

Brain: No acute infarction, hemorrhage, hydrocephalus, extra-axial
collection or mass lesion. Normal white matter. Negative for
demyelinating disease. Normal enhancement of the brain.

Vascular: Normal arterial flow voids.  Normal venous enhancement.

Skull and upper cervical spine: No focal skeletal lesion.

Sinuses/Orbits: Mild mucosal edema paranasal sinuses. Negative orbit

Other: None

MRA HEAD FINDINGS

Internal carotid artery widely patent without stenosis or aneurysm.
Anterior and middle cerebral arteries widely patent bilaterally.

Both vertebral arteries widely patent. PICA patent bilaterally.
Basilar widely patent. Superior cerebellar and posterior cerebral
arteries patent bilaterally. Fetal origin right posterior cerebral
artery. Left posterior communicating artery patent.

Negative for cerebral aneurysm.

MRA NECK FINDINGS

Normal aortic arch and proximal great vessels. Left vertebral artery
origin from the arch, a normal variant.

Antegrade flow in the carotid and vertebral artery bilaterally

Internal carotid artery widely patent bilaterally. Carotid
bifurcation normal. No stenosis or irregularity.

Both vertebral arteries patent to the basilar without stenosis.
IMPRESSION: 1. Normal MRI head without with contrast
2. Normal MRA head
3. Normal MRA neck

## 2021-03-18 MED ORDER — GADOBUTROL 1 MMOL/ML IV SOLN
9.0000 mL | Freq: Once | INTRAVENOUS | Status: AC | PRN
  Administered 2021-03-18: 9 mL via INTRAVENOUS

## 2021-03-18 NOTE — ED Notes (Signed)
Pt ambulated from waiting room to ED room w/o any difficulty and steady gait

## 2021-03-18 NOTE — Discharge Instructions (Addendum)
Please follow-up with neurology.  Call the number tomorrow morning to request follow-up appointment.  If you feel you are having worsening headache, vomiting, neck stiffness, fever or other new concerning symptoms, come back to ER for reassessment.

## 2021-03-18 NOTE — ED Provider Notes (Signed)
[facility] EMERGENCY DEPARTMENT Provider Note   CSN: [id number] Arrival date & time: 03/17/21  1921     History  Chief Complaint  Patient presents with   Dizziness    Joshua Christian is a 31 y.o. male.  The history is provided by the patient, a relative and medical records. No language interpreter was used.  Dizziness Quality:  Imbalance Severity:  Moderate Onset quality:  Gradual Duration:  1 week Timing:  Constant Progression:  Waxing and waning Chronicity:  New Relieved by:  Nothing Worsened by:  Nothing Ineffective treatments:  None tried Associated symptoms: headaches, nausea and vision changes   Associated symptoms: no chest pain, no diarrhea, no palpitations, no shortness of breath, no syncope, no tinnitus, no vomiting and no weakness       Home Medications Prior to Admission medications   Medication Sig Start Date End Date Taking? Authorizing Provider  amoxicillin-clavulanate (AUGMENTIN) 875-125 MG tablet Take 1 tablet by mouth 2 (two) times daily. 11/06/15   Landis Martins, DPM      Allergies    Patient has no known allergies.    Review of Systems   Review of Systems  Constitutional:  Negative for chills, diaphoresis, fatigue and fever.  HENT:  Negative for tinnitus.   Eyes:  Positive for visual disturbance.  Respiratory:  Negative for cough, chest tightness, shortness of breath and wheezing.   Cardiovascular:  Negative for chest pain, palpitations and syncope.  Gastrointestinal:  Positive for nausea. Negative for abdominal pain, diarrhea and vomiting.  Genitourinary:  Negative for flank pain.  Musculoskeletal:  Positive for neck pain. Negative for back pain and neck stiffness.  Skin:  Negative for rash and wound.  Neurological:  Positive for dizziness and headaches. Negative for speech difficulty, weakness, light-headedness and numbness.  Psychiatric/Behavioral:  Positive for confusion. Negative for agitation.   All other systems  reviewed and are negative.  Physical Exam Updated Vital Signs BP 131/69 (BP Location: Left Arm)    Pulse 83    Temp 98.3 F (36.8 C) (Oral)    Resp 16    Ht 6' (1.829 m)    Wt 88.5 kg    SpO2 99%    BMI 26.45 kg/m  Physical Exam Vitals and nursing note reviewed.  Constitutional:      General: He is not in acute distress.    Appearance: He is well-developed. He is not ill-appearing, toxic-appearing or diaphoretic.  HENT:     Head: Normocephalic and atraumatic.     Nose: No congestion or rhinorrhea.     Mouth/Throat:     Mouth: Mucous membranes are moist.     Pharynx: No oropharyngeal exudate or posterior oropharyngeal erythema.  Eyes:     General: No visual field deficit.    Conjunctiva/sclera: Conjunctivae normal.     Pupils: Pupils are equal, round, and reactive to light.  Cardiovascular:     Rate and Rhythm: Normal rate and regular rhythm.     Heart sounds: No murmur heard. Pulmonary:     Effort: Pulmonary effort is normal. No respiratory distress.     Breath sounds: Normal breath sounds. No wheezing, rhonchi or rales.  Chest:     Chest wall: No tenderness.  Abdominal:     General: Abdomen is flat.     Palpations: Abdomen is soft.     Tenderness: There is no abdominal tenderness. There is no right CVA tenderness, left CVA tenderness, guarding or rebound.  Musculoskeletal:  General: No swelling or tenderness.     Cervical back: Neck supple. No tenderness.  Skin:    General: Skin is warm and dry.     Capillary Refill: Capillary refill takes less than 2 seconds.     Findings: No erythema.  Neurological:     Mental Status: He is alert.     GCS: GCS eye subscore is 4. GCS verbal subscore is 5. GCS motor subscore is 6.     Cranial Nerves: No cranial nerve deficit, dysarthria or facial asymmetry.     Sensory: No sensory deficit.     Motor: No weakness or abnormal muscle tone.     Coordination: Finger-Nose-Finger Test normal.     Gait: Gait abnormal (unsteadiness).   Psychiatric:        Mood and Affect: Mood normal.        Cognition and Memory: He exhibits impaired recent memory (reported but not seen today).    ED Results / Procedures / Treatments   Labs (all labs ordered are listed, but only abnormal results are displayed) Labs Reviewed  CBC WITH DIFFERENTIAL/PLATELET - Abnormal; Notable for the following components:      Result Value   RBC 6.09 (*)    Hemoglobin 17.4 (*)    All other components within normal limits  BASIC METABOLIC PANEL - Abnormal; Notable for the following components:   Potassium 3.4 (*)    All other components within normal limits  URINALYSIS, ROUTINE W REFLEX MICROSCOPIC  HIV ANTIBODY (ROUTINE TESTING W REFLEX)  RPR  GC/CHLAMYDIA PROBE AMP ([facility]) NOT AT [facility]    EKG None  Radiology No results found.  Procedures Procedures    Medications Ordered in ED Medications - No data to display  ED Course/ Medical Decision Making/ A&P                           Medical Decision Making Amount and/or Complexity of Data Reviewed Radiology: ordered.   Joshua Christian is a 30 y.o. male with no significant past medical history who presents for several complaints including headache, intermittent vision changes, transient memory loss with confusion, and dizziness/unsteadiness.  Patient reports that this all started when he came back from [place] at the start of January, around a month ago.  He says he has never had headaches in his life before but started having severe headache starting from his neck towards the back of his head and then straight towards his eyes.  He reports it is severe at times with some nausea.  He says that he is also had at times over the last week or so he is been forgetting how to tie his shoes and do simple tasks.  He was getting anxious about his headache and started Lexapro 6 days ago after getting evaluated online.  On Monday he decreased it to half a tablet but this has not helped his  symptoms.  He is still having the transient memory changes, confusion, and is having unsteadiness and dizziness.  He reports the unsteadiness and difficulty with ambulation is only been going on since Monday.  He reports some nausea but no vomiting.  Reports his headaches are waxing and waning but still severe.  He denies any recent neck injury or neck trauma and denies any history of thrombosis.  Denies any previous cancer or history of brain tumors and denies any speech changes.  Of note, he also reports that while he was on a  boat in the [place] some sea water splashed into his nose.  Otherwise denies sinus infection or nasal pains.  Does not know of any [place] zoonotic illnesses at this time.  He denies any numbness, tingling, or weakness focally in extremities and denies any other complaints.  On exam, lungs clear chest nontender.  Abdomen nontender.  Patient is moving all extremities with normal sensation and strength in extremities.  Normal finger-nose-finger testing.  Pupils are symmetric and reactive normal extract movements.  No facial droop.  Symmetric smile.  Normal sensation of the face.  Clear speech.  Normal neck range of motion with no stridor or carotid bruit appreciated.  Patient did not have tenderness in the neck and head but that is where his headache was still present.  No other focal abnormalities on exam however, when patient stood he did get unsteady and nearly fell while trying to walk backwards.  Clinically I suspect patient may have new migraines as he does report some photophobia with that however as he is not want to headaches in the past I am concerned about several different etiologies.  Etiologies considered include dural venous sinus thrombus, stroke, aneurysm, tumor/mass, or vascular dissection.  I touch base with neurology and spoke to Dr. Kathrynn Speed who did recommend MR imaging.  We discussed MRI versus CT and he would prefer MRI with and without  contrast of brain, MRA of the head and neck, and MRV of the head.  This would rule out the different etiologies considered.  We also offered a headache cocktail but as his headache had decreased to a 5 out of 10 he would rather hold on that initially.  Anticipate follow-up on imaging to determine disposition.  If work-up is reassuring, suspect this is more of a new onset of migraines and he will be stable for discharge home with outpatient neurology follow-up for persistent migraines.  Care will be transferred to oncoming team while waiting for imaging results to complete.         Final Clinical Impression(s) / ED Diagnoses Final diagnoses:  Nonintractable headache, unspecified chronicity pattern, unspecified headache type  Unsteadiness  Transient vision disturbance  Memory changes    Clinical Impression: 1. Nonintractable headache, unspecified chronicity pattern, unspecified headache type   2. Unsteadiness   3. Transient vision disturbance   4. Memory changes     Disposition: Care transferred to oncoming team while waiting for results of MRI and reassessment.  This note was prepared with assistance of Systems analyst. Occasional wrong-word or sound-a-like substitutions may have occurred due to the inherent limitations of voice recognition software.      Kehaulani Fruin, Gwenyth Allegra, MD 03/18/21 [phone]

## 2021-03-18 NOTE — ED Notes (Signed)
Pt transported to MRI 

## 2021-03-18 NOTE — ED Notes (Signed)
Pt reports yeast infection w/ some burning w/ urination.

## 2021-03-18 NOTE — ED Notes (Addendum)
Pt reports her is here d/t starting lexapro on Sat and Monday he couldn't remember how to tie shoes or fix a bowl of cereal and fell in the floor. Pt c/o dizziness, nausea and headaches. Pt reports headache onset Jan 1st after coming back from the [place] and the lexapro increased headache intensity. Pt also reports intermittent blurry vision. Pt was instructed by UC to come to ED for further evaluation. Pt reports feeling unsteady when walking. Headaches described as posterior stabbing through to his R eye. Pt reports since stopping the lexapro, his memory has gotten better.

## 2021-03-18 NOTE — ED Provider Notes (Signed)
Sign out note  31 year old male presenting to ER with concern for headache, intermittent vision change, transient memory loss with confusion and intermittent dizziness/unsteadiness.  Case was reviewed with neurology who recommended multiple MRIs of head and neck.  Received signout from Dr. Julieanne Manson.  If MRI imaging is all negative, anticipate discharge with follow-up in the outpatient setting with neurology.  Reviewed MRI imaging, reassessed patient, he appears well in no acute distress, all MR imaging is negative.  Patient endorsed some mild burning with urination, will check UA.  Patient reports he is currently monogamous with 1 male partner.  UA negative for UTI.  Reviewed return precautions with patient and father at bedside, discharged home.     Milagros Loll, MD 03/18/21 [id number]

## 2021-03-19 LAB — RPR: RPR Ser Ql: NONREACTIVE

## 2021-04-19 ENCOUNTER — Ambulatory Visit: Payer: Managed Care, Other (non HMO) | Admitting: Psychiatry

## 2023-08-30 ENCOUNTER — Encounter (HOSPITAL_BASED_OUTPATIENT_CLINIC_OR_DEPARTMENT_OTHER): Payer: Self-pay | Admitting: Emergency Medicine

## 2023-08-30 ENCOUNTER — Ambulatory Visit (HOSPITAL_BASED_OUTPATIENT_CLINIC_OR_DEPARTMENT_OTHER)
Admission: EM | Admit: 2023-08-30 | Discharge: 2023-08-30 | Disposition: A | Discharge status: Home / Self Care | Attending: Family Medicine | Admitting: Family Medicine

## 2023-08-30 ENCOUNTER — Ambulatory Visit (HOSPITAL_BASED_OUTPATIENT_CLINIC_OR_DEPARTMENT_OTHER): Admitting: Radiology

## 2023-08-30 DIAGNOSIS — R0789 Other chest pain: Secondary | ICD-10-CM | POA: Diagnosis not present

## 2023-08-30 NOTE — ED Triage Notes (Signed)
 Pt reports chest tightness on Monday right before he went to sleep on and off, pt reports he had the chest tightness again today but he was also dizzy headed

## 2023-08-30 NOTE — Discharge Instructions (Signed)
 Sorry we couldn't get the machine to work. I do not believe this is cardiac pain. Your x ray was normal. This may be due to dehydration, stress or anxiety.  Make sure that you are staying hydrated and drinking water and electrolytes.  Sign up with primary care to establish with them in case you need referral and routine healthcare.

## 2023-08-30 NOTE — ED Provider Notes (Signed)
 [facility]    CSN: [id number] Arrival date & time: 08/30/23  1730      History   Chief Complaint Chief Complaint  Patient presents with   Dizziness   Chest Pain    HPI Joshua Christian is a 33 y.o. male.   Patient is a 33 year old male who presents today with dizziness, chest pain.  Reports Monday had onset of chest tightness right before he went to sleep off-and-on.  This did resolve.  He then had the chest tightness again today and felt somewhat dizzy.  Denies any cough, shortness of breath.  Denies any fevers or chills.    Dizziness Associated symptoms: chest pain   Chest Pain Associated symptoms: dizziness     History reviewed. No pertinent past medical history.  There are no active problems to display for this patient.   History reviewed. No pertinent surgical history.     Home Medications    Prior to Admission medications   Not on File    Family History History reviewed. No pertinent family history.  Social History Social History   Tobacco Use   Smoking status: Never   Smokeless tobacco: Never  Vaping Use   Vaping status: Never Used  Substance Use Topics   Alcohol use: Not Currently   Drug use: Not Currently     Allergies   Patient has no known allergies.   Review of Systems Review of Systems  Cardiovascular:  Positive for chest pain.  Neurological:  Positive for dizziness.    See HPI Physical Exam Triage Vital Signs ED Triage Vitals  Encounter Vitals Group     BP 08/30/23 1743 137/80     Girls Systolic BP Percentile --      Girls Diastolic BP Percentile --      Boys Systolic BP Percentile --      Boys Diastolic BP Percentile --      Pulse Rate 08/30/23 1743 84     Resp 08/30/23 1743 18     Temp 08/30/23 1743 98.2 F (36.8 C)     Temp Source 08/30/23 1743 Oral     SpO2 08/30/23 1743 97 %     Weight --      Height --      Head Circumference --      Peak Flow --      Pain Score 08/30/23 1742 0     Pain Loc --       Pain Education --      Exclude from Growth Chart --    No data found.  Updated Vital Signs BP 137/80 (BP Location: Right Arm)   Pulse 84   Temp 98.2 F (36.8 C) (Oral)   Resp 18   SpO2 97%   Visual Acuity Right Eye Distance:   Left Eye Distance:   Bilateral Distance:    Right Eye Near:   Left Eye Near:    Bilateral Near:     Physical Exam Constitutional:      General: He is not in acute distress.    Appearance: Normal appearance. He is not ill-appearing, toxic-appearing or diaphoretic.  HENT:     Mouth/Throat:     Pharynx: Oropharynx is clear.  Eyes:     Conjunctiva/sclera: Conjunctivae normal.  Cardiovascular:     Rate and Rhythm: Normal rate and regular rhythm.     Pulses: Normal pulses.     Heart sounds: Normal heart sounds.  Pulmonary:     Effort: Pulmonary effort is normal.  Breath sounds: Normal breath sounds.  Musculoskeletal:        General: Normal range of motion.  Skin:    General: Skin is warm and dry.  Neurological:     General: No focal deficit present.     Mental Status: He is alert.  Psychiatric:        Mood and Affect: Mood normal.      UC Treatments / Results  Labs (all labs ordered are listed, but only abnormal results are displayed) Labs Reviewed - No data to display  EKG   Radiology DG Chest 2 View Result Date: 08/30/2023 CLINICAL DATA:  chest tightness EXAM: CHEST - 2 VIEW COMPARISON:  Chest x-ray 07/25/2007 FINDINGS: The heart and mediastinal contours are within normal limits. No focal consolidation. No pulmonary edema. No pleural effusion. No pneumothorax. No acute osseous abnormality. IMPRESSION: No active cardiopulmonary disease. Electronically Signed   By: Morgane  Naveau M.D.   On: 08/30/2023 18:19    Procedures Procedures (including critical care time)  Medications Ordered in UC Medications - No data to display  Initial Impression / Assessment and Plan / UC Course  I have reviewed the triage vital signs and the  nursing notes.  Pertinent labs & imaging results that were available during my care of the patient were reviewed by me and considered in my medical decision making (see chart for details).     Chest tightness-no concerns on exam.  Patient currently asymptomatic.  Chest x-ray negative.  Attempted EKG but machine was not working but not concerned for cardiac involvement at this time.  Most likely anxiety or mild dehydration.  Recommend make sure drinking plenty of fluids to include electrolytes and staying hydrated. Plans to establish with primary care for healthcare maintenance. Final Clinical Impressions(s) / UC Diagnoses   Final diagnoses:  Chest tightness     Discharge Instructions      Sorry we couldn't get the machine to work. I do not believe this is cardiac pain. Your x ray was normal. This may be due to dehydration, stress or anxiety.  Make sure that you are staying hydrated and drinking water and electrolytes.  Sign up with primary care to establish with them in case you need referral and routine healthcare.     ED Prescriptions   None    PDMP not reviewed this encounter.   Adah Wilbert LABOR, FNP 08/31/23 [phone]

## 2023-09-15 ENCOUNTER — Ambulatory Visit (HOSPITAL_BASED_OUTPATIENT_CLINIC_OR_DEPARTMENT_OTHER): Admitting: Family Medicine

## 2023-09-15 ENCOUNTER — Encounter (HOSPITAL_BASED_OUTPATIENT_CLINIC_OR_DEPARTMENT_OTHER): Payer: Self-pay | Admitting: Family Medicine

## 2023-09-15 VITALS — BP 121/79 | HR 88 | Temp 98.2°F | Resp 16 | Ht 71.65 in | Wt 209.7 lb

## 2023-09-15 DIAGNOSIS — R42 Dizziness and giddiness: Secondary | ICD-10-CM

## 2023-09-15 DIAGNOSIS — R519 Headache, unspecified: Secondary | ICD-10-CM | POA: Diagnosis not present

## 2023-09-15 DIAGNOSIS — Z13220 Encounter for screening for lipoid disorders: Secondary | ICD-10-CM | POA: Diagnosis not present

## 2023-09-15 DIAGNOSIS — F419 Anxiety disorder, unspecified: Secondary | ICD-10-CM

## 2023-09-15 MED ORDER — MECLIZINE HCL 25 MG PO TABS: 25.0000 mg | ORAL_TABLET | Freq: Three times a day (TID) | ORAL | 0 refills | Status: AC | PRN

## 2023-09-15 NOTE — Assessment & Plan Note (Signed)
 Possible migraine.  Resolved.  Address his anxiety.

## 2023-09-15 NOTE — Progress Notes (Signed)
 Established Patient Office Visit  Subjective   Patient ID: Joshua Christian, male    DOB: 09-Oct-1990  Age: 33 y.o. MRN: [id number]  Chief Complaint  Patient presents with   Establish Care    Establish care     F/u as above.  New to my practice.  Recent unexpected dizzy spell.  HA also noted, but not severe.  He was seen in the ER and told he had a Migraine.  He admits his stress level has been high since he got a promotion at work.  Dizziness was not vertigo.  Described as a swimmy headed sensation.  Light caffeine intake.  Admits mild ongoing dizziness at times, such as when he turns his head.  However he denies vertigo.    Past Medical History:  Diagnosis Date   Allergies     Outpatient Encounter Medications as of 09/15/2023  Medication Sig   meclizine  (ANTIVERT ) 25 MG tablet Take 1 tablet (25 mg total) by mouth 3 (three) times daily as needed for dizziness (Sedation precautions and avoid alcohol).   No facility-administered encounter medications on file as of 09/15/2023.    Social History   Tobacco Use   Smoking status: Never   Smokeless tobacco: Never  Vaping Use   Vaping status: Never Used  Substance Use Topics   Alcohol use: Yes    Comment: Rare   Drug use: Never      Review of Systems  Constitutional:  Positive for malaise/fatigue. Negative for weight loss.  Cardiovascular:  Negative for chest pain and palpitations.  Neurological:  Negative for dizziness.  Psychiatric/Behavioral:  Negative for depression, hallucinations, memory loss, substance abuse and suicidal ideas. The patient is nervous/anxious. The patient does not have insomnia.       Objective:     BP 121/79 (BP Location: Right Arm, Patient Position: Standing, Cuff Size: Normal)   Pulse 88   Temp 98.2 F (36.8 C) (Oral)   Resp 16   Ht 5' 11.65 (1.82 m)   Wt 209 lb 11.2 oz (95.1 kg)   SpO2 95%   BMI 28.72 kg/m    Physical Exam Constitutional:      General: He is not in acute distress.     Appearance: Normal appearance.     Comments: Comfortable and benign appearing.  HENT:     Head: Normocephalic.  Neck:     Vascular: No carotid bruit.  Cardiovascular:     Rate and Rhythm: Normal rate and regular rhythm.     Pulses: Normal pulses.     Heart sounds: Normal heart sounds.  Pulmonary:     Effort: Pulmonary effort is normal.     Breath sounds: Normal breath sounds.  Abdominal:     General: Bowel sounds are normal.     Palpations: Abdomen is soft.  Musculoskeletal:     Cervical back: Neck supple. No tenderness.     Right lower leg: No edema.     Left lower leg: No edema.  Neurological:     General: No focal deficit present.     Mental Status: He is alert.     Cranial Nerves: No cranial nerve deficit.     Motor: No weakness.     Coordination: Coordination normal.     Gait: Gait normal.  Psychiatric:     Comments: Mildly anxious      No results found for any visits on 09/15/23.    The ASCVD Risk score (Arnett DK, et al., 2019) failed to calculate  for the following reasons:   The 2019 ASCVD risk score is only valid for ages 43 to 84    Assessment & Plan:  Dizzy spells Assessment & Plan: Unclear etiology.  Arrange for fasting labs soon and f/u with him.  Orders: -     Comprehensive metabolic panel with GFR; Future -     CBC with Differential/Platelet; Future -     Meclizine  HCl; Take 1 tablet (25 mg total) by mouth 3 (three) times daily as needed for dizziness (Sedation precautions and avoid alcohol).  Dispense: 30 tablet; Refill: 0  Lipid screening -     Lipid panel; Future  Nonintractable headache, unspecified chronicity pattern, unspecified headache type Assessment & Plan: Possible migraine.  Resolved.  Address his anxiety.   Anxiety Assessment & Plan: Likely the underlying trigger.  He declines counseling.  He declines an anxiolytic for now in lay terms.  F/u with him next week after his labs are back.     Return in about 3 months (around  12/16/2023) for physical.    DOTTIE PONCE NORLEEN FALCON., MD

## 2023-09-15 NOTE — Assessment & Plan Note (Signed)
 Unclear etiology.  Arrange for fasting labs soon and f/u with him.

## 2023-09-15 NOTE — Assessment & Plan Note (Signed)
 Likely the underlying trigger.  He declines counseling.  He declines an anxiolytic for now in lay terms.  F/u with him next week after his labs are back.

## 2023-09-19 ENCOUNTER — Other Ambulatory Visit (HOSPITAL_BASED_OUTPATIENT_CLINIC_OR_DEPARTMENT_OTHER)

## 2023-09-19 ENCOUNTER — Encounter (HOSPITAL_BASED_OUTPATIENT_CLINIC_OR_DEPARTMENT_OTHER): Payer: Self-pay

## 2023-09-19 DIAGNOSIS — Z13220 Encounter for screening for lipoid disorders: Secondary | ICD-10-CM

## 2023-09-19 DIAGNOSIS — R42 Dizziness and giddiness: Secondary | ICD-10-CM

## 2023-09-20 ENCOUNTER — Ambulatory Visit (HOSPITAL_BASED_OUTPATIENT_CLINIC_OR_DEPARTMENT_OTHER): Payer: Self-pay | Admitting: Family Medicine

## 2023-09-20 LAB — COMPREHENSIVE METABOLIC PANEL WITH GFR
ALT: 80 IU/L — ABNORMAL HIGH (ref 0–44)
AST: 40 IU/L (ref 0–40)
Albumin: 5.2 g/dL — ABNORMAL HIGH (ref 4.1–5.1)
Alkaline Phosphatase: 124 IU/L — ABNORMAL HIGH (ref 44–121)
BUN/Creatinine Ratio: 17 (ref 9–20)
BUN: 14 mg/dL (ref 6–20)
Bilirubin Total: 0.7 mg/dL (ref 0.0–1.2)
CO2: 23 mmol/L (ref 20–29)
Calcium: 10.2 mg/dL (ref 8.7–10.2)
Chloride: 101 mmol/L (ref 96–106)
Creatinine, Ser: 0.84 mg/dL (ref 0.76–1.27)
Globulin, Total: 2.8 g/dL (ref 1.5–4.5)
Glucose: 106 mg/dL — ABNORMAL HIGH (ref 70–99)
Potassium: 4.4 mmol/L (ref 3.5–5.2)
Sodium: 140 mmol/L (ref 134–144)
Total Protein: 8 g/dL (ref 6.0–8.5)
eGFR: 119 mL/min/1.73 (ref >59)

## 2023-09-20 LAB — CBC WITH DIFFERENTIAL/PLATELET
Basophils Absolute: 0.1 x10E3/uL (ref 0.0–0.2)
Basos: 1 %
EOS (ABSOLUTE): 0.4 x10E3/uL (ref 0.0–0.4)
Eos: 5 %
Hematocrit: 54.2 % — ABNORMAL HIGH (ref 37.5–51.0)
Hemoglobin: 17.4 g/dL (ref 13.0–17.7)
Immature Grans (Abs): 0 x10E3/uL (ref 0.0–0.1)
Immature Granulocytes: 0 %
Lymphocytes Absolute: 2.1 x10E3/uL (ref 0.7–3.1)
Lymphs: 28 %
MCH: 28.1 pg (ref 26.6–33.0)
MCHC: 32.1 g/dL (ref 31.5–35.7)
MCV: 88 fL (ref 79–97)
Monocytes Absolute: 0.5 x10E3/uL (ref 0.1–0.9)
Monocytes: 7 %
Neutrophils Absolute: 4.2 x10E3/uL (ref 1.4–7.0)
Neutrophils: 58 %
Platelets: 320 x10E3/uL (ref 150–450)
RBC: 6.19 x10E6/uL — ABNORMAL HIGH (ref 4.14–5.80)
RDW: 13.2 % (ref 11.6–15.4)
WBC: 7.3 x10E3/uL (ref 3.4–10.8)

## 2023-09-20 LAB — LIPID PANEL
Chol/HDL Ratio: 5.4 ratio — ABNORMAL HIGH (ref 0.0–5.0)
Cholesterol, Total: 226 mg/dL — ABNORMAL HIGH (ref 100–199)
HDL: 42 mg/dL (ref >39)
LDL Chol Calc (NIH): 151 mg/dL — ABNORMAL HIGH (ref 0–99)
Triglycerides: 181 mg/dL — ABNORMAL HIGH (ref 0–149)
VLDL Cholesterol Cal: 33 mg/dL (ref 5–40)

## 2023-09-21 ENCOUNTER — Ambulatory Visit (INDEPENDENT_AMBULATORY_CARE_PROVIDER_SITE_OTHER): Admitting: Family Medicine

## 2023-09-21 ENCOUNTER — Encounter (HOSPITAL_BASED_OUTPATIENT_CLINIC_OR_DEPARTMENT_OTHER): Payer: Self-pay | Admitting: Family Medicine

## 2023-09-21 VITALS — BP 124/85 | HR 100 | Temp 98.5°F | Resp 16 | Wt 210.5 lb

## 2023-09-21 DIAGNOSIS — F419 Anxiety disorder, unspecified: Secondary | ICD-10-CM | POA: Diagnosis not present

## 2023-09-21 DIAGNOSIS — R7989 Other specified abnormal findings of blood chemistry: Secondary | ICD-10-CM | POA: Insufficient documentation

## 2023-09-21 DIAGNOSIS — E785 Hyperlipidemia, unspecified: Secondary | ICD-10-CM | POA: Diagnosis not present

## 2023-09-21 NOTE — Assessment & Plan Note (Signed)
Improved. Monitor.  

## 2023-09-21 NOTE — Progress Notes (Signed)
 Established Patient Office Visit  Subjective   Patient ID: Joshua Christian, male    DOB: [birth year]  Age: 33 y.o. MRN: [id number]  Chief Complaint  Patient presents with   Follow-up    Follow-up visit    F/u as above.  We reviewed his labs in some detail.  His anxiety has improved.  He again states he rarely drinks alcohol and doesn't take Tylenol.  He does take OTC Tumeric and I advised him to stop this for now.  His dizziness and headaches have also resolved.    Past Medical History:  Diagnosis Date   Allergies    Anxiety    Dyslipidemia     Outpatient Encounter Medications as of 09/21/2023  Medication Sig   meclizine  (ANTIVERT ) 25 MG tablet Take 1 tablet (25 mg total) by mouth 3 (three) times daily as needed for dizziness (Sedation precautions and avoid alcohol). (Patient not taking: Reported on 09/21/2023)   No facility-administered encounter medications on file as of 09/21/2023.    Social History   Tobacco Use   Smoking status: Never   Smokeless tobacco: Never  Vaping Use   Vaping status: Never Used  Substance Use Topics   Alcohol use: Yes    Comment: Rare   Drug use: Never      Review of Systems  Constitutional:  Negative for malaise/fatigue and weight loss.  Cardiovascular:  Negative for chest pain and palpitations.  Neurological:  Negative for dizziness.  Psychiatric/Behavioral:  Negative for depression, hallucinations, memory loss, substance abuse and suicidal ideas. The patient is nervous/anxious. The patient does not have insomnia.       Objective:     BP 124/85 (BP Location: Right Arm, Patient Position: Standing, Cuff Size: Normal)   Pulse 100   Temp 98.5 F (36.9 C) (Oral)   Resp 16   Wt 210 lb 8 oz (95.5 kg)   SpO2 95%   BMI 28.83 kg/m    Physical Exam Constitutional:      General: He is not in acute distress.    Appearance: Normal appearance.  HENT:     Head: Normocephalic.  Neck:     Vascular: No carotid bruit.  Cardiovascular:      Rate and Rhythm: Normal rate and regular rhythm.     Pulses: Normal pulses.     Heart sounds: Normal heart sounds.  Pulmonary:     Effort: Pulmonary effort is normal.     Breath sounds: Normal breath sounds.  Abdominal:     General: Bowel sounds are normal.     Palpations: Abdomen is soft.  Musculoskeletal:     Cervical back: Neck supple. No tenderness.     Right lower leg: No edema.     Left lower leg: No edema.  Neurological:     Mental Status: He is alert.      No results found for any visits on 09/21/23.    The ASCVD Risk score (Arnett DK, et al., 2019) failed to calculate for the following reasons:   The 2019 ASCVD risk score is only valid for ages 42 to 21    Assessment & Plan:  Elevated liver function tests Assessment & Plan: Unclear etiology.  Stop Tumeric and await repeat Liver panel.  Might need an Ultrasound.  Orders: -     Hepatic function panel  Anxiety Assessment & Plan: Improved.  Monitor.   Dyslipidemia Assessment & Plan: Low fat diet.  Repeat in about 6 months.     Return in  about 6 months (around 03/23/2024) for chronic follow-up.    REDDING PONCE NORLEEN FALCON., MD

## 2023-09-21 NOTE — Assessment & Plan Note (Signed)
 Low fat diet.  Repeat in about 6 months.

## 2023-09-21 NOTE — Assessment & Plan Note (Signed)
 Unclear etiology.  Stop Tumeric and await repeat Liver panel.  Might need an Ultrasound.

## 2023-09-22 LAB — HEPATIC FUNCTION PANEL
ALT: 67 IU/L — ABNORMAL HIGH (ref 0–44)
AST: 35 IU/L (ref 0–40)
Albumin: 4.7 g/dL (ref 4.1–5.1)
Alkaline Phosphatase: 115 IU/L (ref 44–121)
Bilirubin Total: 0.4 mg/dL (ref 0.0–1.2)
Bilirubin, Direct: 0.14 mg/dL (ref 0.00–0.40)
Total Protein: 7.3 g/dL (ref 6.0–8.5)

## 2023-09-25 ENCOUNTER — Ambulatory Visit (HOSPITAL_BASED_OUTPATIENT_CLINIC_OR_DEPARTMENT_OTHER): Payer: Self-pay | Admitting: Family Medicine

## 2023-09-25 ENCOUNTER — Other Ambulatory Visit (HOSPITAL_BASED_OUTPATIENT_CLINIC_OR_DEPARTMENT_OTHER): Payer: Self-pay | Admitting: Family Medicine

## 2023-09-25 DIAGNOSIS — R7989 Other specified abnormal findings of blood chemistry: Secondary | ICD-10-CM

## 2023-10-03 ENCOUNTER — Ambulatory Visit (HOSPITAL_BASED_OUTPATIENT_CLINIC_OR_DEPARTMENT_OTHER)
Admission: RE | Admit: 2023-10-03 | Discharge: 2023-10-03 | Disposition: A | Discharge status: Home / Self Care | Source: Ambulatory Visit | Attending: Family Medicine | Admitting: Family Medicine

## 2023-10-03 DIAGNOSIS — R7989 Other specified abnormal findings of blood chemistry: Secondary | ICD-10-CM | POA: Diagnosis not present

## 2023-10-09 ENCOUNTER — Ambulatory Visit (HOSPITAL_BASED_OUTPATIENT_CLINIC_OR_DEPARTMENT_OTHER): Payer: Self-pay | Admitting: Student

## 2023-10-09 DIAGNOSIS — K760 Fatty (change of) liver, not elsewhere classified: Secondary | ICD-10-CM | POA: Insufficient documentation

## 2023-10-09 NOTE — Progress Notes (Signed)
 Patient's ultrasound came back which showed steatosis. Steatosis means there is extra fat stored in the liver. When too much fat builds up in the liver, it can make it harder for the liver to work well. If this gets worse, it can cause scarring (fibrosis) and even cirrhosis, which is serious liver damage. Typically even losing a small amount of weight (about 5-10% of body weight) can help reduce fat in the liver and lower the risk of liver problems. Currently, based on his labs- it would be unlikely that he has serious fibrosis in his liver.  Please ask Dr. Dottie on Tuesday if he would like to see Sagan back soon to discuss further.  Fibrosis 4 Score = .43

## 2023-11-03 ENCOUNTER — Ambulatory Visit (HOSPITAL_BASED_OUTPATIENT_CLINIC_OR_DEPARTMENT_OTHER): Admitting: Family Medicine

## 2023-12-15 ENCOUNTER — Encounter (HOSPITAL_BASED_OUTPATIENT_CLINIC_OR_DEPARTMENT_OTHER): Admitting: Family Medicine

## 2024-03-22 ENCOUNTER — Ambulatory Visit (HOSPITAL_BASED_OUTPATIENT_CLINIC_OR_DEPARTMENT_OTHER): Admitting: Family Medicine
